# Patient Record
Sex: Female | Born: 1960 | Race: White | Hispanic: No | Marital: Married | Smoking: Never smoker
Health system: Southern US, Community
[De-identification: ages and names within clinical notes are randomized; demographics above are authoritative.]

## PROBLEM LIST (undated history)

## (undated) DIAGNOSIS — F419 Anxiety disorder, unspecified: Secondary | ICD-10-CM

## (undated) DIAGNOSIS — E039 Hypothyroidism, unspecified: Secondary | ICD-10-CM

## (undated) DIAGNOSIS — Z853 Personal history of malignant neoplasm of breast: Secondary | ICD-10-CM

## (undated) DIAGNOSIS — Z8489 Family history of other specified conditions: Secondary | ICD-10-CM

## (undated) DIAGNOSIS — Z98811 Dental restoration status: Secondary | ICD-10-CM

## (undated) HISTORY — PX: HYSTEROSCOPY WITH D & C: SHX1775

## (undated) HISTORY — PX: LAPAROSCOPIC OOPHERECTOMY: SHX6507

## (undated) HISTORY — PX: CATARACT EXTRACTION W/ INTRAOCULAR LENS IMPLANT: SHX1309

---

## 1997-10-05 ENCOUNTER — Other Ambulatory Visit: Admission: RE | Admit: 1997-10-05 | Discharge: 1997-10-05 | Payer: Self-pay | Admitting: Obstetrics and Gynecology

## 1997-11-19 ENCOUNTER — Emergency Department (HOSPITAL_COMMUNITY): Admission: EM | Admit: 1997-11-19 | Discharge: 1997-11-19 | Payer: Self-pay | Admitting: Emergency Medicine

## 1997-11-21 ENCOUNTER — Ambulatory Visit (HOSPITAL_COMMUNITY): Admission: RE | Admit: 1997-11-21 | Discharge: 1997-11-21 | Payer: Self-pay | Admitting: Emergency Medicine

## 1997-11-25 ENCOUNTER — Emergency Department (HOSPITAL_COMMUNITY): Admission: EM | Admit: 1997-11-25 | Discharge: 1997-11-25 | Payer: Self-pay | Admitting: Internal Medicine

## 1997-12-02 ENCOUNTER — Encounter (HOSPITAL_COMMUNITY): Admission: RE | Admit: 1997-12-02 | Discharge: 1998-03-02 | Payer: Self-pay | Admitting: Emergency Medicine

## 1997-12-03 ENCOUNTER — Emergency Department (HOSPITAL_COMMUNITY): Admission: EM | Admit: 1997-12-03 | Discharge: 1997-12-03 | Payer: Self-pay | Admitting: Emergency Medicine

## 1997-12-06 ENCOUNTER — Emergency Department (HOSPITAL_COMMUNITY): Admission: EM | Admit: 1997-12-06 | Discharge: 1997-12-06 | Payer: Self-pay | Admitting: Internal Medicine

## 1998-02-18 ENCOUNTER — Encounter (HOSPITAL_COMMUNITY): Admission: RE | Admit: 1998-02-18 | Discharge: 1998-02-18 | Payer: Self-pay | Admitting: Emergency Medicine

## 1998-10-05 ENCOUNTER — Other Ambulatory Visit: Admission: RE | Admit: 1998-10-05 | Discharge: 1998-10-05 | Payer: Self-pay | Admitting: Obstetrics and Gynecology

## 1999-11-07 ENCOUNTER — Other Ambulatory Visit: Admission: RE | Admit: 1999-11-07 | Discharge: 1999-11-07 | Payer: Self-pay | Admitting: Obstetrics and Gynecology

## 2001-01-01 ENCOUNTER — Other Ambulatory Visit: Admission: RE | Admit: 2001-01-01 | Discharge: 2001-01-01 | Payer: Self-pay | Admitting: *Deleted

## 2002-01-20 ENCOUNTER — Other Ambulatory Visit: Admission: RE | Admit: 2002-01-20 | Discharge: 2002-01-20 | Payer: Self-pay | Admitting: Obstetrics and Gynecology

## 2004-04-12 ENCOUNTER — Encounter (INDEPENDENT_AMBULATORY_CARE_PROVIDER_SITE_OTHER): Payer: Self-pay | Admitting: Diagnostic Radiology

## 2004-04-12 ENCOUNTER — Encounter: Admission: RE | Admit: 2004-04-12 | Discharge: 2004-04-12 | Payer: Self-pay | Admitting: Obstetrics and Gynecology

## 2004-04-12 ENCOUNTER — Encounter (INDEPENDENT_AMBULATORY_CARE_PROVIDER_SITE_OTHER): Payer: Self-pay | Admitting: *Deleted

## 2004-04-19 ENCOUNTER — Encounter (HOSPITAL_COMMUNITY): Admission: RE | Admit: 2004-04-19 | Discharge: 2004-07-18 | Payer: Self-pay | Admitting: General Surgery

## 2004-04-26 ENCOUNTER — Encounter: Admission: RE | Admit: 2004-04-26 | Discharge: 2004-04-26 | Payer: Self-pay | Admitting: General Surgery

## 2004-05-01 ENCOUNTER — Encounter (INDEPENDENT_AMBULATORY_CARE_PROVIDER_SITE_OTHER): Payer: Self-pay | Admitting: *Deleted

## 2004-05-01 ENCOUNTER — Ambulatory Visit (HOSPITAL_COMMUNITY): Admission: RE | Admit: 2004-05-01 | Discharge: 2004-05-01 | Payer: Self-pay | Admitting: General Surgery

## 2004-05-01 ENCOUNTER — Ambulatory Visit (HOSPITAL_BASED_OUTPATIENT_CLINIC_OR_DEPARTMENT_OTHER): Admission: RE | Admit: 2004-05-01 | Discharge: 2004-05-01 | Payer: Self-pay | Admitting: General Surgery

## 2004-05-01 HISTORY — PX: MASTECTOMY, PARTIAL: SHX709

## 2004-05-02 ENCOUNTER — Ambulatory Visit: Payer: Self-pay | Admitting: Oncology

## 2004-05-10 ENCOUNTER — Ambulatory Visit: Admission: RE | Admit: 2004-05-10 | Discharge: 2004-05-12 | Payer: Self-pay | Admitting: Radiation Oncology

## 2004-05-17 ENCOUNTER — Ambulatory Visit (HOSPITAL_BASED_OUTPATIENT_CLINIC_OR_DEPARTMENT_OTHER): Admission: RE | Admit: 2004-05-17 | Discharge: 2004-05-17 | Payer: Self-pay | Admitting: General Surgery

## 2004-05-17 ENCOUNTER — Ambulatory Visit (HOSPITAL_COMMUNITY): Admission: RE | Admit: 2004-05-17 | Discharge: 2004-05-17 | Payer: Self-pay | Admitting: General Surgery

## 2004-05-17 ENCOUNTER — Encounter (INDEPENDENT_AMBULATORY_CARE_PROVIDER_SITE_OTHER): Payer: Self-pay | Admitting: Specialist

## 2004-05-17 HISTORY — PX: RE-EXCISION OF BREAST LUMPECTOMY: SHX6048

## 2004-06-07 ENCOUNTER — Ambulatory Visit: Admission: RE | Admit: 2004-06-07 | Discharge: 2004-09-05 | Payer: Self-pay | Admitting: Radiation Oncology

## 2004-06-21 ENCOUNTER — Encounter: Admission: RE | Admit: 2004-06-21 | Discharge: 2004-06-21 | Payer: Self-pay | Admitting: Radiation Oncology

## 2004-06-23 ENCOUNTER — Encounter: Admission: RE | Admit: 2004-06-23 | Discharge: 2004-06-23 | Payer: Self-pay | Admitting: General Surgery

## 2004-06-23 ENCOUNTER — Encounter (INDEPENDENT_AMBULATORY_CARE_PROVIDER_SITE_OTHER): Payer: Self-pay | Admitting: *Deleted

## 2004-07-06 ENCOUNTER — Encounter: Admission: RE | Admit: 2004-07-06 | Discharge: 2004-07-06 | Payer: Self-pay | Admitting: General Surgery

## 2004-07-06 ENCOUNTER — Encounter (INDEPENDENT_AMBULATORY_CARE_PROVIDER_SITE_OTHER): Payer: Self-pay | Admitting: Specialist

## 2004-07-06 ENCOUNTER — Ambulatory Visit (HOSPITAL_COMMUNITY): Admission: RE | Admit: 2004-07-06 | Discharge: 2004-07-06 | Payer: Self-pay | Admitting: General Surgery

## 2004-07-06 ENCOUNTER — Ambulatory Visit (HOSPITAL_BASED_OUTPATIENT_CLINIC_OR_DEPARTMENT_OTHER): Admission: RE | Admit: 2004-07-06 | Discharge: 2004-07-06 | Payer: Self-pay | Admitting: General Surgery

## 2004-07-06 HISTORY — PX: BREAST EXCISIONAL BIOPSY: SUR124

## 2004-07-24 ENCOUNTER — Encounter: Admission: RE | Admit: 2004-07-24 | Discharge: 2004-07-24 | Payer: Self-pay | Admitting: Radiation Oncology

## 2004-07-26 ENCOUNTER — Ambulatory Visit: Payer: Self-pay | Admitting: Oncology

## 2004-09-06 ENCOUNTER — Ambulatory Visit: Admission: RE | Admit: 2004-09-06 | Discharge: 2004-10-01 | Payer: Self-pay | Admitting: Radiation Oncology

## 2004-10-18 ENCOUNTER — Ambulatory Visit: Payer: Self-pay | Admitting: Oncology

## 2004-10-19 ENCOUNTER — Ambulatory Visit: Admission: RE | Admit: 2004-10-19 | Discharge: 2004-10-19 | Payer: Self-pay | Admitting: Radiation Oncology

## 2005-04-11 ENCOUNTER — Ambulatory Visit: Payer: Self-pay | Admitting: Oncology

## 2005-05-18 ENCOUNTER — Encounter: Admission: RE | Admit: 2005-05-18 | Discharge: 2005-05-18 | Payer: Self-pay | Admitting: General Surgery

## 2005-10-09 ENCOUNTER — Ambulatory Visit: Payer: Self-pay | Admitting: Oncology

## 2005-10-11 LAB — COMPREHENSIVE METABOLIC PANEL
ALT: <8 U/L (ref 0–40)
Albumin: 3.9 g/dL (ref 3.5–5.2)
Alkaline Phosphatase: 38 U/L — ABNORMAL LOW (ref 39–117)
CO2: 26 mEq/L (ref 19–32)
Glucose, Bld: 113 mg/dL — ABNORMAL HIGH (ref 70–99)
Potassium: 3.7 mEq/L (ref 3.5–5.3)
Sodium: 140 mEq/L (ref 135–145)
Total Bilirubin: 0.3 mg/dL (ref 0.3–1.2)
Total Protein: 6.5 g/dL (ref 6.0–8.3)

## 2005-10-11 LAB — CBC WITH DIFFERENTIAL/PLATELET
BASO%: 0.5 % (ref 0.0–2.0)
Eosinophils Absolute: 0 10*3/uL (ref 0.0–0.5)
LYMPH%: 31 % (ref 14.0–48.0)
MCHC: 34.2 g/dL (ref 32.0–36.0)
MONO#: 0.3 10*3/uL (ref 0.1–0.9)
MONO%: 6.2 % (ref 0.0–13.0)
NEUT#: 3.3 10*3/uL (ref 1.5–6.5)
RBC: 3.97 10*6/uL (ref 3.70–5.32)
RDW: 13.1 % (ref 11.3–14.5)
WBC: 5.3 10*3/uL (ref 3.9–10.0)

## 2006-05-20 ENCOUNTER — Encounter: Admission: RE | Admit: 2006-05-20 | Discharge: 2006-05-20 | Payer: Self-pay | Admitting: Oncology

## 2006-05-20 ENCOUNTER — Ambulatory Visit (HOSPITAL_COMMUNITY): Admission: RE | Admit: 2006-05-20 | Discharge: 2006-05-20 | Payer: Self-pay | Admitting: *Deleted

## 2006-11-12 ENCOUNTER — Ambulatory Visit: Payer: Self-pay | Admitting: Oncology

## 2006-11-14 LAB — CBC WITH DIFFERENTIAL/PLATELET
BASO%: 0.6 % (ref 0.0–2.0)
EOS%: 1.3 % (ref 0.0–7.0)
HCT: 34.4 % — ABNORMAL LOW (ref 34.8–46.6)
LYMPH%: 30.8 % (ref 14.0–48.0)
MCH: 30.1 pg (ref 26.0–34.0)
MCHC: 34.1 g/dL (ref 32.0–36.0)
MCV: 88.4 fL (ref 81.0–101.0)
MONO#: 0.2 10*3/uL (ref 0.1–0.9)
MONO%: 6 % (ref 0.0–13.0)
NEUT%: 61.3 % (ref 39.6–76.8)
Platelets: 296 10*3/uL (ref 145–400)
RBC: 3.89 10*6/uL (ref 3.70–5.32)
WBC: 4.1 10*3/uL (ref 3.9–10.0)

## 2006-11-14 LAB — COMPREHENSIVE METABOLIC PANEL
ALT: 8 U/L (ref 0–35)
AST: 9 U/L (ref 0–37)
Albumin: 3.9 g/dL (ref 3.5–5.2)
Alkaline Phosphatase: 37 U/L — ABNORMAL LOW (ref 39–117)
Glucose, Bld: 86 mg/dL (ref 70–99)
Potassium: 4 mEq/L (ref 3.5–5.3)
Sodium: 138 mEq/L (ref 135–145)
Total Protein: 6.6 g/dL (ref 6.0–8.3)

## 2007-02-13 ENCOUNTER — Ambulatory Visit (HOSPITAL_COMMUNITY): Admission: RE | Admit: 2007-02-13 | Discharge: 2007-02-13 | Payer: Self-pay | Admitting: *Deleted

## 2007-02-13 ENCOUNTER — Encounter (INDEPENDENT_AMBULATORY_CARE_PROVIDER_SITE_OTHER): Payer: Self-pay | Admitting: *Deleted

## 2007-05-22 ENCOUNTER — Encounter: Admission: RE | Admit: 2007-05-22 | Discharge: 2007-05-22 | Payer: Self-pay | Admitting: Oncology

## 2007-06-02 ENCOUNTER — Ambulatory Visit: Payer: Self-pay | Admitting: Oncology

## 2007-06-04 LAB — COMPREHENSIVE METABOLIC PANEL
AST: 12 U/L (ref 0–37)
Albumin: 4.1 g/dL (ref 3.5–5.2)
Alkaline Phosphatase: 46 U/L (ref 39–117)
Potassium: 4.3 mEq/L (ref 3.5–5.3)
Sodium: 141 mEq/L (ref 135–145)
Total Bilirubin: 0.2 mg/dL — ABNORMAL LOW (ref 0.3–1.2)
Total Protein: 7.1 g/dL (ref 6.0–8.3)

## 2007-06-04 LAB — CBC WITH DIFFERENTIAL/PLATELET
EOS%: 1.1 % (ref 0.0–7.0)
MCH: 29.5 pg (ref 26.0–34.0)
MCHC: 33.6 g/dL (ref 32.0–36.0)
MCV: 87.8 fL (ref 81.0–101.0)
MONO%: 5.5 % (ref 0.0–13.0)
RBC: 3.98 10*6/uL (ref 3.70–5.32)
RDW: 13.8 % (ref 11.3–14.5)

## 2007-10-31 ENCOUNTER — Emergency Department (HOSPITAL_COMMUNITY): Admission: EM | Admit: 2007-10-31 | Discharge: 2007-10-31 | Payer: Self-pay | Admitting: Emergency Medicine

## 2007-12-03 ENCOUNTER — Ambulatory Visit: Payer: Self-pay | Admitting: Oncology

## 2007-12-08 LAB — CBC WITH DIFFERENTIAL/PLATELET
BASO%: 0.9 % (ref 0.0–2.0)
Basophils Absolute: 0.1 10*3/uL (ref 0.0–0.1)
EOS%: 1.1 % (ref 0.0–7.0)
HGB: 11.3 g/dL — ABNORMAL LOW (ref 11.6–15.9)
LYMPH%: 34.1 % (ref 14.0–48.0)
MONO%: 5.7 % (ref 0.0–13.0)
NEUT#: 3.3 10*3/uL (ref 1.5–6.5)
NEUT%: 58.3 % (ref 39.6–76.8)
RBC: 3.91 10*6/uL (ref 3.70–5.32)
RDW: 14.2 % (ref 11.3–14.5)
WBC: 5.6 10*3/uL (ref 3.9–10.0)

## 2007-12-08 LAB — COMPREHENSIVE METABOLIC PANEL
Albumin: 4.2 g/dL (ref 3.5–5.2)
BUN: 15 mg/dL (ref 6–23)
CO2: 26 mEq/L (ref 19–32)
Calcium: 8.8 mg/dL (ref 8.4–10.5)
Chloride: 104 mEq/L (ref 96–112)
Glucose, Bld: 107 mg/dL — ABNORMAL HIGH (ref 70–99)
Potassium: 4 mEq/L (ref 3.5–5.3)
Sodium: 140 mEq/L (ref 135–145)
Total Protein: 7 g/dL (ref 6.0–8.3)

## 2008-04-09 ENCOUNTER — Ambulatory Visit (HOSPITAL_COMMUNITY): Admission: RE | Admit: 2008-04-09 | Discharge: 2008-04-09 | Payer: Self-pay | Admitting: Obstetrics & Gynecology

## 2008-04-09 ENCOUNTER — Encounter (INDEPENDENT_AMBULATORY_CARE_PROVIDER_SITE_OTHER): Payer: Self-pay | Admitting: Obstetrics & Gynecology

## 2008-05-07 ENCOUNTER — Ambulatory Visit (HOSPITAL_COMMUNITY): Admission: RE | Admit: 2008-05-07 | Discharge: 2008-05-07 | Payer: Self-pay | Admitting: Obstetrics & Gynecology

## 2008-05-07 ENCOUNTER — Encounter (INDEPENDENT_AMBULATORY_CARE_PROVIDER_SITE_OTHER): Payer: Self-pay | Admitting: Obstetrics & Gynecology

## 2008-05-07 HISTORY — PX: LAPAROSCOPIC SALPINGOOPHERECTOMY: SUR795

## 2008-07-14 ENCOUNTER — Ambulatory Visit: Payer: Self-pay | Admitting: Oncology

## 2008-07-16 LAB — COMPREHENSIVE METABOLIC PANEL
Albumin: 4.2 g/dL (ref 3.5–5.2)
BUN: 17 mg/dL (ref 6–23)
CO2: 26 mEq/L (ref 19–32)
Glucose, Bld: 83 mg/dL (ref 70–99)
Potassium: 4.1 mEq/L (ref 3.5–5.3)
Sodium: 140 mEq/L (ref 135–145)
Total Bilirubin: 0.3 mg/dL (ref 0.3–1.2)
Total Protein: 7.2 g/dL (ref 6.0–8.3)

## 2008-07-16 LAB — CBC WITH DIFFERENTIAL/PLATELET
Basophils Absolute: 0 10*3/uL (ref 0.0–0.1)
Eosinophils Absolute: 0 10*3/uL (ref 0.0–0.5)
HGB: 9.4 g/dL — ABNORMAL LOW (ref 11.6–15.9)
LYMPH%: 33 % (ref 14.0–48.0)
MCV: 79.9 fL — ABNORMAL LOW (ref 81.0–101.0)
MONO#: 0.3 10*3/uL (ref 0.1–0.9)
NEUT#: 2.7 10*3/uL (ref 1.5–6.5)
Platelets: 368 10*3/uL (ref 145–400)
RBC: 3.66 10*6/uL — ABNORMAL LOW (ref 3.70–5.32)
WBC: 4.6 10*3/uL (ref 3.9–10.0)

## 2009-01-05 ENCOUNTER — Ambulatory Visit: Payer: Self-pay | Admitting: Oncology

## 2009-01-07 LAB — COMPREHENSIVE METABOLIC PANEL
AST: 17 U/L (ref 0–37)
Albumin: 4 g/dL (ref 3.5–5.2)
Alkaline Phosphatase: 39 U/L (ref 39–117)
BUN: 15 mg/dL (ref 6–23)
Creatinine, Ser: 1.08 mg/dL (ref 0.40–1.20)
Glucose, Bld: 91 mg/dL (ref 70–99)
Potassium: 3.5 mEq/L (ref 3.5–5.3)
Total Bilirubin: 0.4 mg/dL (ref 0.3–1.2)

## 2009-01-07 LAB — CBC WITH DIFFERENTIAL/PLATELET
BASO%: 2.8 % — ABNORMAL HIGH (ref 0.0–2.0)
LYMPH%: 27.9 % (ref 14.0–49.7)
MCHC: 32 g/dL (ref 31.5–36.0)
MCV: 79.7 fL (ref 79.5–101.0)
MONO#: 0.2 10*3/uL (ref 0.1–0.9)
MONO%: 4.4 % (ref 0.0–14.0)
NEUT#: 3.2 10*3/uL (ref 1.5–6.5)
Platelets: 321 10*3/uL (ref 145–400)
RBC: 3.89 10*6/uL (ref 3.70–5.45)
RDW: 17.1 % — ABNORMAL HIGH (ref 11.2–14.5)
WBC: 4.9 10*3/uL (ref 3.9–10.3)

## 2009-04-19 ENCOUNTER — Ambulatory Visit (HOSPITAL_COMMUNITY): Admission: RE | Admit: 2009-04-19 | Discharge: 2009-04-19 | Payer: Self-pay | Admitting: Oncology

## 2009-04-19 ENCOUNTER — Encounter: Admission: RE | Admit: 2009-04-19 | Discharge: 2009-04-19 | Payer: Self-pay | Admitting: Oncology

## 2009-04-25 ENCOUNTER — Ambulatory Visit (HOSPITAL_COMMUNITY): Admission: RE | Admit: 2009-04-25 | Discharge: 2009-04-25 | Payer: Self-pay | Admitting: Oncology

## 2009-04-25 ENCOUNTER — Encounter: Admission: RE | Admit: 2009-04-25 | Discharge: 2009-04-25 | Payer: Self-pay | Admitting: Oncology

## 2009-08-01 ENCOUNTER — Ambulatory Visit: Payer: Self-pay | Admitting: Oncology

## 2009-08-17 LAB — COMPREHENSIVE METABOLIC PANEL
ALT: 13 U/L (ref 0–35)
AST: 18 U/L (ref 0–37)
Albumin: 3.8 g/dL (ref 3.5–5.2)
Alkaline Phosphatase: 36 U/L — ABNORMAL LOW (ref 39–117)
BUN: 13 mg/dL (ref 6–23)
CO2: 29 mEq/L (ref 19–32)
Calcium: 8.8 mg/dL (ref 8.4–10.5)
Chloride: 105 mEq/L (ref 96–112)
Creatinine, Ser: 1.08 mg/dL (ref 0.40–1.20)
Glucose, Bld: 84 mg/dL (ref 70–99)
Potassium: 3.6 mEq/L (ref 3.5–5.3)
Sodium: 139 mEq/L (ref 135–145)
Total Bilirubin: 0.4 mg/dL (ref 0.3–1.2)
Total Protein: 6.9 g/dL (ref 6.0–8.3)

## 2009-08-17 LAB — CBC WITH DIFFERENTIAL/PLATELET
BASO%: 0.6 % (ref 0.0–2.0)
Basophils Absolute: 0 10*3/uL (ref 0.0–0.1)
EOS%: 1.5 % (ref 0.0–7.0)
Eosinophils Absolute: 0.1 10*3/uL (ref 0.0–0.5)
HCT: 30.5 % — ABNORMAL LOW (ref 34.8–46.6)
HGB: 10.1 g/dL — ABNORMAL LOW (ref 11.6–15.9)
LYMPH%: 31.2 % (ref 14.0–49.7)
MCH: 29 pg (ref 25.1–34.0)
MCHC: 33.1 g/dL (ref 31.5–36.0)
MCV: 87.7 fL (ref 79.5–101.0)
MONO#: 0.3 10*3/uL (ref 0.1–0.9)
MONO%: 6 % (ref 0.0–14.0)
NEUT#: 3.4 10*3/uL (ref 1.5–6.5)
NEUT%: 60.7 % (ref 38.4–76.8)
Platelets: 311 10*3/uL (ref 145–400)
RBC: 3.47 10*6/uL — ABNORMAL LOW (ref 3.70–5.45)
RDW: 16.5 % — ABNORMAL HIGH (ref 11.2–14.5)
WBC: 5.6 10*3/uL (ref 3.9–10.3)
lymph#: 1.7 10*3/uL (ref 0.9–3.3)

## 2009-08-17 LAB — VITAMIN D 25 HYDROXY (VIT D DEFICIENCY, FRACTURES): Vit D, 25-Hydroxy: 11 ng/mL — ABNORMAL LOW (ref 30–89)

## 2009-08-17 LAB — IRON AND TIBC: Iron: 25 ug/dL — ABNORMAL LOW (ref 42–145)

## 2009-08-17 LAB — LACTATE DEHYDROGENASE: LDH: 135 U/L (ref 94–250)

## 2009-09-06 ENCOUNTER — Ambulatory Visit: Payer: Self-pay | Admitting: Oncology

## 2010-03-21 ENCOUNTER — Ambulatory Visit: Payer: Self-pay | Admitting: Oncology

## 2010-05-16 ENCOUNTER — Encounter
Admission: RE | Admit: 2010-05-16 | Discharge: 2010-05-16 | Payer: Self-pay | Source: Home / Self Care | Attending: Oncology | Admitting: Oncology

## 2010-05-23 ENCOUNTER — Ambulatory Visit: Payer: Self-pay | Admitting: Oncology

## 2010-05-24 LAB — CBC & DIFF AND RETIC
BASO%: 0.7 % (ref 0.0–2.0)
Basophils Absolute: 0 10*3/uL (ref 0.0–0.1)
EOS%: 1.7 % (ref 0.0–7.0)
HGB: 11.9 g/dL (ref 11.6–15.9)
Immature Retic Fract: 13.4 % — ABNORMAL HIGH (ref 0.00–10.70)
MCH: 29.2 pg (ref 25.1–34.0)
MCHC: 32.2 g/dL (ref 31.5–36.0)
MONO#: 0.3 10*3/uL (ref 0.1–0.9)
RDW: 12.5 % (ref 11.2–14.5)
Retic Ct Abs: 43.66 10*3/uL (ref 18.30–72.70)
WBC: 5.8 10*3/uL (ref 3.9–10.3)
lymph#: 2 10*3/uL (ref 0.9–3.3)

## 2010-05-24 LAB — COMPREHENSIVE METABOLIC PANEL
ALT: 26 U/L (ref 0–35)
AST: 24 U/L (ref 0–37)
Creatinine, Ser: 0.95 mg/dL (ref 0.40–1.20)
Total Bilirubin: 0.4 mg/dL (ref 0.3–1.2)

## 2010-05-24 LAB — MORPHOLOGY: PLT EST: ADEQUATE

## 2010-05-25 LAB — VITAMIN D 25 HYDROXY (VIT D DEFICIENCY, FRACTURES): Vit D, 25-Hydroxy: 14 ng/mL — ABNORMAL LOW (ref 30–89)

## 2010-05-25 LAB — IRON AND TIBC
%SAT: 9 % — ABNORMAL LOW (ref 20–55)
TIBC: 382 ug/dL (ref 250–470)
UIBC: 349 ug/dL

## 2010-06-25 ENCOUNTER — Encounter: Payer: Self-pay | Admitting: Oncology

## 2010-07-20 ENCOUNTER — Other Ambulatory Visit: Payer: Self-pay | Admitting: Oncology

## 2010-07-20 DIAGNOSIS — Z9889 Other specified postprocedural states: Secondary | ICD-10-CM

## 2010-10-17 NOTE — Op Note (Signed)
Brandi Goodman, Brandi Goodman            ACCOUNT NO.:  [id number]   MEDICAL RECORD NO.:  [id number]          PATIENT TYPE:  AMB   LOCATION:  SDC                           FACILITY:  [facility]   PHYSICIAN:  Genia Del, M.D.DATE OF BIRTH:  Aug 31, 1960   DATE OF PROCEDURE:  05/07/2008  DATE OF DISCHARGE:                               OPERATIVE REPORT   PREOPERATIVE DIAGNOSIS:  Left hemorrhagic ovarian cyst.   POSTOPERATIVE DIAGNOSIS:  Left hemorrhagic ovarian cyst with inflamed  left tube.   PROCEDURE:  Laparoscopy, left salpingo-oophorectomy, and evacuation of  hemoperitoneum.   SURGEON:  Genia Del, MD   ASSISTANT:  None.   ANESTHESIOLOGIST:  Angelica Pou, MD   PROCEDURE:  Under general anesthesia with endotracheal intubation, the  patient was in lithotomy position, she was prepped with Betadine on the  abdominal suprapubic, vulvar, and vaginal areas and draped as usual.  A  Foley catheter was put in the bladder.  We inserted a speculum in the  vagina and a tenaculum with intrauterine cannula was inserted.  We  removed the speculum.  We went to abdominal time.  The subcutaneous  tissue was infiltrated with Marcaine one-quarter plain at the  infraumbilical area and incision was done over 1.5 cm with scalpel.  The  aponeurosis was opened with Mayo scissors under direct vision and the  parietal peritoneum was opened bluntly with the finger.  We made a  pursestring stitch of Vicryl 0 at the aponeurosis.  We then inserted the  [facility] with the laparoscope at that level and created pneumoperitoneum  with CO2.  Inspection of the abdominal cavity was normal.  In the pelvic  cavity, we saw large amount of blood clots and that clotted structure  was originating from the left adnexa.  There was a small amount of  hemoperitoneum around the large clotted blood.  We made a contralateral  incision on the left iliac area with a scalpel over 5 mm after  infiltrating the skin with Marcaine  one-quarter plain.  We inserted a 5-  mm trocar at that level under direct vision.  We did the same thing on  the right side.  We then inserted the instruments, first the Nezhat and  an atraumatic clamp.  We irrigated and suctioned as much blood and clots  as possible.  We then visualized the uterus, which was normal in size  and appearance and no evidence of trauma was seen from the recent  hysteroscopy resection, which was done by myself 3 weeks ago.  We  visualized also the right adnexa, very small simple cysts were present  on the ovary.  The right tube was normal in size and appearance.  On the  left adnexa, we succeeded in suctioning enough to visualize the left  tube, which was enlarged and inflamed.  The fimbriae were present and no  hydrosalpinx was present.  The left ovary was not identifiable given the  large cyst mixed with a lot of blood clots.  No clear ovarian tissue  being visible.  The decision was taken to proceed with a left salpingo-  oophorectomy.  We  used the EndoSeal to achieve that.  First the left  ureter was well identified and in normal anatomic position.  We  cauterized and sectioned the left infundibulopelvic ligament, followed  the border of the tube and ovary inferiorly.  We then cauterized and  sectioned the left tube proximally and the left utero-ovarian ligament  and detached the left ovary and tube completely.  We then changed the  camera to a 5 mm and inserted an Endobag through the infraumbilical  trocar.  We removed the left adnexa in the bag and sent it to pathology.  We then went back with the Angio-Seal and completed hemostasis on the  left adnexal pedicles.  Hemostasis was adequate at all levels.  We then  irrigated and suctioned the abdominopelvic cavities.  Pictures were  taken before proceeding with the left salpingo-oophorectomy and after.  We then remove all instruments.  We removed the trocars.  We evacuated  the CO2 as much as possible.  We  closed the aponeurosis with a  pursestring stitch at the infraumbilical incision.  We then closed the  skin at that incision with a Monocryl 4-0 subcuticular stitch.  We  applied Dermabond on all incisions.  The instrument was removed from the  vagina.  The estimated blood loss was 250 mL that was mainly old blood  clots associated with the left adnexa and the hemoperitoneum found at  the beginning of the intervention.  No complication occurred and the  patient was brought to recovery room in good stable status.  Note, that  a dose of Ancef 1 g IV was given before induction.      Genia Del, M.D.  Electronically Signed     ML/MEDQ  D:  05/07/2008  T:  05/08/2008  Job:  [id number]

## 2010-10-17 NOTE — Op Note (Signed)
NAMEPEBBLE, BOTKIN            ACCOUNT NO.:  [id number]   MEDICAL RECORD NO.:  [id number]          PATIENT TYPE:  AMB   LOCATION:  SDC                           [facility]   PHYSICIAN:  [place] B. Earlene Plater, M.D.  DATE OF BIRTH:  1961/02/15   DATE OF PROCEDURE:  02/13/2007  DATE OF DISCHARGE:                               OPERATIVE REPORT   PREOP DIAGNOSIS:  Abnormal uterine bleeding with tamoxifen use and  cervical stenosis.   POSTOPERATIVE DIAGNOSIS:  Abnormal uterine bleeding with tamoxifen use  and cervical stenosis.   PROCEDURE:  Hysteroscopy and dilation and curettage with ultrasound  guidance.   SURGEON:  Chester Holstein. Earlene Plater, M.D.   ASSISTANT:  None.   ANESTHESIA:  LMA and general and 20 mL of 1% lidocaine paracervical  block.   FINDINGS:  Shaggy endometrial tissue without focal mass.   SPECIMEN:  Endometrial curettings to pathology.   BLOOD LOSS:  Minimal.   FLUID DEFICIT:  None.   COMPLICATIONS:  None.   INDICATIONS:  The patient with history of breast cancer using tamoxifen  with recent history of abnormal bleeding.  Ultrasound showed a thickened  endometrial stripe.  Attempt was made in the office to perform  endometrial biopsy which could not be performed nor could a saline  infusion ultrasound be performed in the office for the same reason.  Given this, patient presents for procedure under anesthesia and  ultrasound guidance.  The patient advised of the risks of surgery  include infection, bleeding, perforation, and damage to surrounding  organs.   DESCRIPTION OF PROCEDURE:  The patient taken to the operating room LMA  general anesthesia obtained.  She was prepped and draped in the standard  fashion.  The bladder was left undrained to facilitate ultrasound  guidance.  Speculum inserted, paracervical block placed.  Cervix grasped  anteriorly with a single-tooth tenaculum.  Ultrasound guidance was used.  The lower uterine segment, anterior cervical os  region, could not be  well visualized due to an incompletely full bladder.  Therefore, the  bladder was catheterized and approximately 150 mL of sterile saline was  instilled.  This allowed for good bladder distention and excellent  visualization.  Subsequently it was very easy to dilate the cervix under  ultrasound guidance.  Then the diagnostic hysteroscope was inserted  after being flushed.  Good uterine distention obtained.  No focal mass  was seen, although the overall appearance was of a shaggy endometrium.   The endometrial was then gently curetted under ultrasound guidance and a  small amount of tissue returned.  The instruments were removed.  Cervix  hemostatic.  The patient tolerated the procedure well with no  complications.  Patient was taken to the recovery room awake, alert, and  stable condition.      Gerri Spore B. Earlene Plater, M.D.  Electronically Signed     WBD/MEDQ  D:  02/13/2007  T:  02/14/2007  Job:  [id number]   cc:   Rose Phi. Young, M.D.  [address], [address]  [place]  [place] [address]

## 2010-10-17 NOTE — Op Note (Signed)
[name], Brandi Goodman            ACCOUNT NO.:  [id number]   MEDICAL RECORD NO.:  [id number]          PATIENT TYPE:  AMB   LOCATION:  SDC                           [facility]   PHYSICIAN:  Genia Del, M.D.DATE OF BIRTH:  10-02-1960   DATE OF PROCEDURE:  04/09/2008  DATE OF DISCHARGE:                               OPERATIVE REPORT   PREOPERATIVE DIAGNOSES:  1. Thick endometrium with probable intrauterine polyp.  2. Menometrorrhagia on tamoxifen.   POSTOPERATIVE DIAGNOSES:  1. Thick endometrium with probable intrauterine polyp.  2. Menometrorrhagia on tamoxifen.  3. Confirmed anterior wall endometrial polyps.   PROCEDURE:  Hysteroscopy resection and dilatation and curettage.   SURGEON:  Genia Del, MD   ASSISTANT:  No.   ANESTHESIOLOGIST:  Belva Agee, MD   PROCEDURE:  Under general anesthesia with laryngeal mask, the patient  was in lithotomy position.  She was prepped with Betadine on the  suprapubic, vulvar, and vaginal areas.  The bladder was catheterized and  the patient was draped as usual.  The vaginal exam reveals an anteverted  uterus, mobile, normal volume, no adnexal mass.  The speculum was  introduced in the vagina.  The anterior lip of the cervix was grasped  with a tenaculum.  A paracervical block was done with Nesacaine 1%, 20  mL total at 4 and 8 o'clock.  We then dilate the cervix with Hegar  dilators up to #35 without difficulty.  We inserted the operative  hysteroscope in the intrauterine cavity.  Visualization of the entire  intrauterine cavity, both ostia were seen and pictures were taken.  The  anterior wall of the endometrial cavity, multiple polyps were present.  No other lesion was seen.  We proceed with resection of the endometrial  polyps.  Those were sent separately to Pathology.  We then removed the  hysteroscope.  A systematic endometrial curettage on all surfaces with a  sharp curette.  The specimen of endometrial  curettings was sent to  Pathology.  We had good hemostasis.  We therefore removed all  instruments.  The patient's estimated blood loss was minimal.  No  complications occurred and the patient was brought to recovery room in  good stable status.      Genia Del, M.D.  Electronically Signed     ML/MEDQ  D:  04/09/2008  T:  04/10/2008  Job:  [id number]

## 2010-10-24 NOTE — Op Note (Signed)
Brandi Goodman, Brandi Goodman            ACCOUNT NO.:  [id number]   MEDICAL RECORD NO.:  [id number]          PATIENT TYPE:  AMB   LOCATION:  DSC                          FACILITY:  [facility]   PHYSICIAN:  Rose Phi. Young, M.D.   DATE OF BIRTH:  Jan 23, 1961   DATE OF PROCEDURE:  07/06/2004  DATE OF DISCHARGE:                                 OPERATIVE REPORT   PREOPERATIVE DIAGNOSIS:  Ductal carcinoma in situ of the left breast.   POSTOPERATIVE DIAGNOSIS:  Ductal carcinoma in situ of the left breast.   OPERATION:  Left breast biopsy with needle localized and specimen mammogram.   SURGEON:  Dr. Francina Ames.   ANESTHESIA:  General.   OPERATIVE PROCEDURE:  This 50 year old female had previously had a DCIS of  the left breast excised and had a sentinel node that was negative.  On her  preradiation mammogram, however, there is a new little area of  calcifications closer to the nipple from the original excision that had not  been identified before.  Stereotactic biopsy of these showed DCIS, and it  was felt this needed to be excised before we committed to breast  preservation.   Prior to coming to the operating room, a localizing needle had been placed  in the upper outer quadrant of her left breast.   After suitable general anesthesia was induced, the patient was placed in the  supine position with the arms extended on the arm board and the left breast  prepped and draped in the usual fashion.  I used a more transverse incision  here in order not to create a gap between two curved incisions where I was  concerned about the vascularity of the skin, and we centered the needle in  the excision and then excised the tissue around it.  Specimen mammogram  confirmed the removal of the clip.   We had good hemostasis.  The area was infiltrated with 0.25% Marcaine.  It  was closed in a single layer of 4-0 Monocryl and Steri-Strips.   Dressings were then applied and the patient transferred to the  recovery room  in satisfactory condition having tolerated the procedure well.      PRY/MEDQ  D:  07/06/2004  T:  07/06/2004  Job:  [id number]

## 2010-10-24 NOTE — Op Note (Signed)
Brandi Goodman, Brandi Goodman            ACCOUNT NO.:  [id number]   MEDICAL RECORD NO.:  [id number]          PATIENT TYPE:  AMB   LOCATION:  DSC                          FACILITY:  [facility]   PHYSICIAN:  Rose Phi. Maple Hudson, M.D.   DATE OF BIRTH:  09/19/1960   DATE OF PROCEDURE:  05/17/2004  DATE OF DISCHARGE:                                 OPERATIVE REPORT   PREOPERATIVE DIAGNOSIS:  Carcinoma of the left breast, with a positive  margin.   POSTOPERATIVE DIAGNOSIS:  Carcinoma of the left breast, with a positive  margin.   OPERATION:  Re-excision of left lumpectomy site.   SURGEON:  Rose Phi. Maple Hudson, M.D.   ANESTHESIA:  General.   OPERATIVE PROCEDURE:  The patient placed on the operating room table and the  arm extended on the armboard; left breast was prepped and draped in the  usual fashion.  An elliptical incision around the previous lumpectomy scar  was outlined with a marking pencil, and then I incised that and excised the  entire lumpectomy site, except for the deep lateral margin.  The medial  margin was the one we were most concerned about, and that is where I took  the most tissue.  After removing the specimen, I oriented it for the  pathologist.  Hemostasis was obtained with electrocautery.  We thoroughly  irrigated it with saline.   It was then closed in a single layer of interrupted subcuticular 4-0  Monocryl's with Steri-Strips.  Dressing applied.  The patient transferred to  the recovery room in satisfactory condition, having tolerated the procedure  well.      [name]   PRY/MEDQ  D:  05/17/2004  T:  05/17/2004  Job:  [id number]

## 2010-10-24 NOTE — Op Note (Signed)
NAMEELLYANA, Brandi Goodman            ACCOUNT NO.:  [id number]   MEDICAL RECORD NO.:  [id number]          PATIENT TYPE:  AMB   LOCATION:  DSC                          FACILITY:  [facility]   PHYSICIAN:  Rose Phi. Maple Hudson, M.D.   DATE OF BIRTH:  04/07/1961   DATE OF PROCEDURE:  05/01/2004  DATE OF DISCHARGE:                                 OPERATIVE REPORT   PREOPERATIVE DIAGNOSIS:  Ductal carcinoma in situ -- left breast, with  palpable mass.   POSTOPERATIVE DIAGNOSIS:  Ductal carcinoma in situ -- left breast, with  palpable mass.   OPERATIONS:  1.  Blue dye injection.  2.  Left axillary sentinel lymph node biopsy.  3.  Left partial mastectomy.   SURGEON:  Rose Phi. Maple Hudson, M.D.   ANESTHESIA:  General.   OPERATIVE PROCEDURE:  Prior to coming to the operating room, 1 mC of  technetium-sulfur colloid was injected intradermally.  After suitable general anesthesia was induced, the patient was placed in the  supine position, with the arms extended on the arm board.  Then 5 cc of a  mixture of 2 cc of methylene blue and 3 cc of injectable saline was injected  into the subareolar breast tissue, and the breast gently massaged for three  minutes.  We then prepped and draped in the standard fashion.   Scanning with the Neo probe, the only hot spot was in the left axilla.  A  short transverse left axillary incision was made, with dissection through  the subcutaneous tissue to the clavipectoralis fascia.  Just deep to this  was a blue and hot lymph node, which we removed as a sentinel node.  There  were no other blue, hot or palpable nodes.  This was then submitted to the  pathologist as a sentinel node.   The palpable mass was in the upper outer quadrant, and a curved incision  including an ellipse of skin was then outlined over the palpable mass.  The  incision was made and then a wide excision of this mass was carried out.  Hemostasis obtained with electrocautery.  The deep dissections at the  chest  wall with the pectoralis fascia.  Specimen was then oriented for the  pathologist, and was submitted to evaluate for margins.   The sentinel node was negative for metastatic disease.  The margins were  clean.   With good hemostasis, both incisions were injected with 0.25% Marcaine.  They were closed in two layers with 3-0 Vicryl and subcuticular 4-0 Monocryl  and Steri-Strips.  Dressings were then applied.  The patient transferred to  the recovery room in satisfactory condition, having tolerated the procedure  well.      [name]   PRY/MEDQ  D:  05/01/2004  T:  05/01/2004  Job:  [id number]

## 2011-03-06 LAB — CBC
HCT: 34.5 — ABNORMAL LOW
MCHC: 32.2
MCV: 85.7
RBC: 4.03

## 2011-03-09 LAB — TYPE AND SCREEN
ABO/RH(D): O POS
Antibody Screen: NEGATIVE

## 2011-03-09 LAB — HCG, SERUM, QUALITATIVE: Preg, Serum: NEGATIVE

## 2011-03-16 LAB — CBC
HCT: 37
MCV: 88.3
Platelets: 527 — ABNORMAL HIGH
RDW: 13.6
WBC: 8.4

## 2011-03-16 LAB — DIFFERENTIAL
Basophils Absolute: 0.1
Eosinophils Absolute: 0.1
Eosinophils Relative: 1
Lymphs Abs: 2.2
Neutrophils Relative %: 66

## 2011-03-16 LAB — PREGNANCY, URINE: Preg Test, Ur: NEGATIVE

## 2011-04-30 ENCOUNTER — Telehealth: Payer: Self-pay | Admitting: *Deleted

## 2011-04-30 NOTE — Telephone Encounter (Signed)
informed patient of the new date and time on 05-22-2011 starting at 12:00 with labs.

## 2011-05-22 ENCOUNTER — Other Ambulatory Visit: Payer: Self-pay | Admitting: Oncology

## 2011-05-22 ENCOUNTER — Ambulatory Visit (HOSPITAL_BASED_OUTPATIENT_CLINIC_OR_DEPARTMENT_OTHER): Payer: PRIVATE HEALTH INSURANCE | Admitting: Oncology

## 2011-05-22 ENCOUNTER — Other Ambulatory Visit: Payer: PRIVATE HEALTH INSURANCE | Admitting: Lab

## 2011-05-22 ENCOUNTER — Telehealth: Payer: Self-pay | Admitting: *Deleted

## 2011-05-22 VITALS — BP 109/74 | HR 86 | Temp 98.4°F | Ht 67.0 in | Wt 145.1 lb

## 2011-05-22 DIAGNOSIS — C50919 Malignant neoplasm of unspecified site of unspecified female breast: Secondary | ICD-10-CM

## 2011-05-22 DIAGNOSIS — E559 Vitamin D deficiency, unspecified: Secondary | ICD-10-CM

## 2011-05-22 DIAGNOSIS — D0590 Unspecified type of carcinoma in situ of unspecified breast: Secondary | ICD-10-CM

## 2011-05-22 DIAGNOSIS — D649 Anemia, unspecified: Secondary | ICD-10-CM

## 2011-05-22 LAB — CBC WITH DIFFERENTIAL/PLATELET
BASO%: 0.4 % (ref 0.0–2.0)
EOS%: 0.7 % (ref 0.0–7.0)
MCH: 24.8 pg — ABNORMAL LOW (ref 25.1–34.0)
MCHC: 32 g/dL (ref 31.5–36.0)
MONO#: 0.3 10*3/uL (ref 0.1–0.9)
RBC: 3.88 10*6/uL (ref 3.70–5.45)
RDW: 19.8 % — ABNORMAL HIGH (ref 11.2–14.5)
WBC: 5.9 10*3/uL (ref 3.9–10.3)
lymph#: 1.3 10*3/uL (ref 0.9–3.3)

## 2011-05-22 NOTE — Telephone Encounter (Signed)
gave patient appointment for 11-2011 and mammogram and bone density on 06-2011 printed out calendar and gave to the patient

## 2011-05-22 NOTE — Progress Notes (Signed)
Hematology and Oncology Follow Up Visit  Brandi Goodman [id number] 03-01-1961 50 y.o. 05/22/2011 12:53 PM PCP dr Seymour Bars, dr Purnell Shoemaker  Principle Diagnosis: *DCIS on tamoxifen with status post lumpectomy with reexcision on December of 2006, status post radiation therapy completed April 2006, status post five years of tamoxifen completed April of 2011.  Interim History:  There have been no intercurrent illness, hospitalizations or medication changes.due for mammogram , turned 50, needs a bone density test.  Medications: I have reviewed the patient's current medications.  Allergies: Allergies not on file  Past Medical History, Surgical history, Social history, and Family History were reviewed and updated.  Review of Systems: Constitutional:  Negative for fever, chills, night sweats, anorexia, weight loss, pain. Cardiovascular: no chest pain or dyspnea on exertion Respiratory: no cough, shortness of breath, or wheezing Neurological: no TIA or stroke symptoms Dermatological: negative ENT: negative Skin Gastrointestinal: no abdominal pain, change in bowel habits, or black or bloody stools Genito-Urinary: no dysuria, trouble voiding, or hematuria Hematological and Lymphatic: negative Breast: negative for breast lumps Musculoskeletal: negative Remaining ROS negative. Heavy menses  Physical Exam: Blood pressure 109/74, pulse 86, temperature 98.4 F (36.9 C), height 5\' 7"  (1.702 m), weight 145 lb 1.6 oz (65.817 kg). ECOG:  General appearance: alert, cooperative and appears stated age Head: Normocephalic, without obvious abnormality, atraumatic Neck: no adenopathy, no carotid bruit, no JVD, supple, symmetrical, trachea midline and thyroid not enlarged, symmetric, no tenderness/mass/nodules Lymph nodes: Cervical, supraclavicular, and axillary nodes normal. Cardiac : regular rate and rhythm, no murmurs or gallops Pulmonary:clear to auscultation bilaterally and normal percussion  bilaterally Breasts: inspection negative, no nipple discharge or bleeding, no masses or nodularity palpable Abdomen:soft, non-tender; bowel sounds normal; no masses,  no organomegaly Extremities negative Neuro: alert, oriented, normal speech, no focal findings or movement disorder noted  Lab Results: Lab Results  Component Value Date   WBC 5.9 05/22/2011   HGB 9.6* 05/22/2011   HCT 30.0* 05/22/2011   MCV 77.4* 05/22/2011   PLT 359 05/22/2011     Chemistry      Component Value Date/Time   NA 143 05/24/2010 1621   K 3.7 05/24/2010 1621   CL 104 05/24/2010 1621   CO2 29 05/24/2010 1621   BUN 12 05/24/2010 1621   CREATININE 0.95 05/24/2010 1621      Component Value Date/Time   CALCIUM 9.4 05/24/2010 1621   ALKPHOS 63 05/24/2010 1621   AST 24 05/24/2010 1621   ALT 26 05/24/2010 1621   BILITOT 0.4 05/24/2010 1621      .pathology. Radiological Studies: chest X-ray n/a Mammogram Due this month Bone density Due this month  Impression and Plan: Ms Cham is doing well, but is quite anemic and has very low vitamin d,  I have recommended 1. Consider hysterectomy 2. Receive iv iron 3. Refer to [facility] for colonoscopy 4. Vit d supplementation 5. Bone density test   More than 50% of the visit was spent in patient-related counselling   Pierce Crane, MD 12/18/201212:53 PM

## 2011-05-23 LAB — COMPREHENSIVE METABOLIC PANEL
ALT: <8 U/L (ref 0–35)
AST: 12 U/L (ref 0–37)
Albumin: 4.2 g/dL (ref 3.5–5.2)
CO2: 28 mEq/L (ref 19–32)
Calcium: 8.8 mg/dL (ref 8.4–10.5)
Chloride: 103 mEq/L (ref 96–112)
Creatinine, Ser: 0.9 mg/dL (ref 0.50–1.10)
Potassium: 4 mEq/L (ref 3.5–5.3)
Sodium: 139 mEq/L (ref 135–145)
Total Protein: 7 g/dL (ref 6.0–8.3)

## 2011-06-01 ENCOUNTER — Other Ambulatory Visit: Payer: Self-pay | Admitting: Lab

## 2011-06-01 ENCOUNTER — Ambulatory Visit: Payer: Self-pay | Admitting: Oncology

## 2011-06-14 ENCOUNTER — Ambulatory Visit
Admission: RE | Admit: 2011-06-14 | Discharge: 2011-06-14 | Disposition: A | Discharge status: Home / Self Care | Payer: PRIVATE HEALTH INSURANCE | Source: Ambulatory Visit | Attending: Oncology | Admitting: Oncology

## 2011-06-14 DIAGNOSIS — C50919 Malignant neoplasm of unspecified site of unspecified female breast: Secondary | ICD-10-CM

## 2011-06-14 DIAGNOSIS — D649 Anemia, unspecified: Secondary | ICD-10-CM

## 2011-06-14 DIAGNOSIS — Z9889 Other specified postprocedural states: Secondary | ICD-10-CM

## 2011-07-26 ENCOUNTER — Encounter: Payer: Self-pay | Admitting: Gastroenterology

## 2011-11-20 ENCOUNTER — Other Ambulatory Visit: Payer: PRIVATE HEALTH INSURANCE | Admitting: Lab

## 2011-11-21 ENCOUNTER — Telehealth: Payer: Self-pay | Admitting: *Deleted

## 2011-11-21 ENCOUNTER — Other Ambulatory Visit (HOSPITAL_BASED_OUTPATIENT_CLINIC_OR_DEPARTMENT_OTHER): Payer: PRIVATE HEALTH INSURANCE | Admitting: Lab

## 2011-11-21 DIAGNOSIS — C50919 Malignant neoplasm of unspecified site of unspecified female breast: Secondary | ICD-10-CM

## 2011-11-21 DIAGNOSIS — D649 Anemia, unspecified: Secondary | ICD-10-CM

## 2011-11-21 LAB — CBC & DIFF AND RETIC
BASO%: 0.4 % (ref 0.0–2.0)
LYMPH%: 25.3 % (ref 14.0–49.7)
MCHC: 28.7 g/dL — ABNORMAL LOW (ref 31.5–36.0)
MCV: 75.8 fL — ABNORMAL LOW (ref 79.5–101.0)
MONO%: 7.3 % (ref 0.0–14.0)
Platelets: 328 10*3/uL (ref 145–400)
RBC: 3.68 10*6/uL — ABNORMAL LOW (ref 3.70–5.45)
Retic %: 1.44 % (ref 0.70–2.10)
WBC: 4.5 10*3/uL (ref 3.9–10.3)

## 2011-11-21 LAB — COMPREHENSIVE METABOLIC PANEL
AST: 12 U/L (ref 0–37)
BUN: 13 mg/dL (ref 6–23)
Calcium: 8.8 mg/dL (ref 8.4–10.5)
Chloride: 105 mEq/L (ref 96–112)
Creatinine, Ser: 0.89 mg/dL (ref 0.50–1.10)
Total Bilirubin: 0.3 mg/dL (ref 0.3–1.2)

## 2011-11-21 LAB — IRON AND TIBC
Iron: <10 ug/dL — ABNORMAL LOW (ref 42–145)
UIBC: 407 ug/dL — ABNORMAL HIGH (ref 125–400)

## 2011-11-21 LAB — LACTATE DEHYDROGENASE: LDH: 126 U/L (ref 94–250)

## 2011-11-21 NOTE — Telephone Encounter (Signed)
Called home and cell # [email or web address] to call me

## 2011-11-25 ENCOUNTER — Encounter: Payer: Self-pay | Admitting: Oncology

## 2011-11-27 ENCOUNTER — Telehealth: Payer: Self-pay | Admitting: *Deleted

## 2011-11-27 ENCOUNTER — Ambulatory Visit: Payer: PRIVATE HEALTH INSURANCE | Admitting: Oncology

## 2011-11-27 ENCOUNTER — Ambulatory Visit (HOSPITAL_BASED_OUTPATIENT_CLINIC_OR_DEPARTMENT_OTHER): Payer: PRIVATE HEALTH INSURANCE | Admitting: Oncology

## 2011-11-27 VITALS — BP 119/75 | HR 93 | Temp 98.9°F | Ht 67.0 in | Wt 148.5 lb

## 2011-11-27 DIAGNOSIS — D509 Iron deficiency anemia, unspecified: Secondary | ICD-10-CM

## 2011-11-27 DIAGNOSIS — D649 Anemia, unspecified: Secondary | ICD-10-CM

## 2011-11-27 DIAGNOSIS — D0590 Unspecified type of carcinoma in situ of unspecified breast: Secondary | ICD-10-CM

## 2011-11-27 DIAGNOSIS — C50919 Malignant neoplasm of unspecified site of unspecified female breast: Secondary | ICD-10-CM

## 2011-11-27 DIAGNOSIS — E559 Vitamin D deficiency, unspecified: Secondary | ICD-10-CM

## 2011-11-27 DIAGNOSIS — N920 Excessive and frequent menstruation with regular cycle: Secondary | ICD-10-CM

## 2011-11-27 NOTE — Telephone Encounter (Signed)
One week before the md appointment sent michelle a email to set up treatment for the 12-07-2011

## 2011-11-27 NOTE — Progress Notes (Signed)
Hematology and Oncology Follow Up Visit  QUETZALLI CLOS [id number] [birth year] 51 y.o. 11/27/2011 5:55 PM PCP dr Seymour Bars, dr Purnell Shoemaker  Principle Diagnosis: *DCIS on tamoxifen with status post lumpectomy with reexcision on December of 2006, status post radiation therapy completed April 2006, status post five years of tamoxifen completed April of 2011. History iron deficiency anemia secondary to menorrhagia  Interim History:  There have been no intercurrent illness, hospitalizations or medication changes She did not have her IV iron is as scheduled. She continues to feel somewhat fatigued. She did not have a colonoscopy.  Medications: I have reviewed the patient's current medications.  Allergies: No Known Allergies  Past Medical History, Surgical history, Social history, and Family History were reviewed and updated.  Review of Systems: Constitutional:  Negative for fever, chills, night sweats, anorexia, weight loss, pain. Cardiovascular: no chest pain or dyspnea on exertion Respiratory: no cough, shortness of breath, or wheezing Neurological: no TIA or stroke symptoms Dermatological: negative ENT: negative Skin Gastrointestinal: no abdominal pain, change in bowel habits, or black or bloody stools Genito-Urinary: no dysuria, trouble voiding, or hematuria Hematological and Lymphatic: negative Breast: negative for breast lumps Musculoskeletal: negative Remaining ROS negative. Heavy menses  Physical Exam: Blood pressure 119/75, pulse 93, temperature 98.9 F (37.2 C), temperature source Oral, height 5\' 7"  (1.702 m), weight 148 lb 8 oz (67.359 kg). ECOG:  General appearance: alert, cooperative and appears stated age Head: Normocephalic, without obvious abnormality, atraumatic Neck: no adenopathy, no carotid bruit, no JVD, supple, symmetrical, trachea midline and thyroid not enlarged, symmetric, no tenderness/mass/nodules Lymph nodes: Cervical, supraclavicular, and axillary nodes  normal. Cardiac : regular rate and rhythm, no murmurs or gallops Pulmonary:clear to auscultation bilaterally and normal percussion bilaterally Breasts: inspection negative, no nipple discharge or bleeding, no masses or nodularity palpable, left breast status post lumpectomy no obvious masses. Abdomen:soft, non-tender; bowel sounds normal; no masses,  no organomegaly Extremities negative Neuro: alert, oriented, normal speech, no focal findings or movement disorder noted  Lab Results: Lab Results  Component Value Date   WBC 4.5 11/21/2011   HGB 8.0* 11/21/2011   HCT 27.9* 11/21/2011   MCV 75.8* 11/21/2011   PLT 328 11/21/2011     Chemistry      Component Value Date/Time   NA 141 11/21/2011 0740   K 4.4 11/21/2011 0740   CL 105 11/21/2011 0740   CO2 28 11/21/2011 0740   BUN 13 11/21/2011 0740   CREATININE 0.89 11/21/2011 0740      Component Value Date/Time   CALCIUM 8.8 11/21/2011 0740   ALKPHOS 42 11/21/2011 0740   AST 12 11/21/2011 0740   ALT <8 11/21/2011 0740   BILITOT 0.3 11/21/2011 0740      .pathology. Radiological Studies: chest X-ray n/a Mammogram Due this month Bone density Due this month  Impression and Plan: DCIS status post lumpectomy radiation and 5 years of tamoxifen.  History of anemia secondary to menorrhagia. I recommended she get IV iron. She sees her gynecologist who will hopefully perform what sounds like a hysteroscopy. I will see her in 6 months for followup mammogram. More than 50% of the visit was spent in patient-related counselling   Pierce Crane, MD 6/25/20135:55 PM

## 2011-11-29 ENCOUNTER — Telehealth: Payer: Self-pay | Admitting: *Deleted

## 2011-11-29 NOTE — Telephone Encounter (Signed)
PATIENT CONFIRMED OVER THE PHONE THE NEW DATE AND TIME FOR THE IRON IV

## 2011-12-07 ENCOUNTER — Ambulatory Visit (HOSPITAL_BASED_OUTPATIENT_CLINIC_OR_DEPARTMENT_OTHER): Payer: PRIVATE HEALTH INSURANCE

## 2011-12-07 VITALS — BP 124/68 | HR 90 | Temp 97.5°F

## 2011-12-07 DIAGNOSIS — D649 Anemia, unspecified: Secondary | ICD-10-CM

## 2011-12-07 MED ORDER — SODIUM CHLORIDE 0.9 % IV SOLN
Freq: Once | INTRAVENOUS | Status: AC
  Administered 2011-12-07: 10:00:00 via INTRAVENOUS

## 2011-12-07 MED ORDER — SODIUM CHLORIDE 0.9 % IV SOLN
1020.0000 mg | Freq: Once | INTRAVENOUS | Status: AC
  Administered 2011-12-07: 1020 mg via INTRAVENOUS
  Filled 2011-12-07: qty 34

## 2011-12-07 MED ORDER — SODIUM CHLORIDE 0.9 % IJ SOLN
10.0000 mL | INTRAMUSCULAR | Status: DC | PRN
  Filled 2011-12-07: qty 10

## 2012-05-22 ENCOUNTER — Other Ambulatory Visit: Payer: Self-pay | Admitting: *Deleted

## 2012-05-22 DIAGNOSIS — D649 Anemia, unspecified: Secondary | ICD-10-CM

## 2012-05-22 DIAGNOSIS — C50919 Malignant neoplasm of unspecified site of unspecified female breast: Secondary | ICD-10-CM

## 2012-05-23 ENCOUNTER — Other Ambulatory Visit (HOSPITAL_BASED_OUTPATIENT_CLINIC_OR_DEPARTMENT_OTHER): Payer: PRIVATE HEALTH INSURANCE | Admitting: Lab

## 2012-05-23 DIAGNOSIS — C50419 Malignant neoplasm of upper-outer quadrant of unspecified female breast: Secondary | ICD-10-CM

## 2012-05-24 ENCOUNTER — Telehealth: Payer: Self-pay | Admitting: *Deleted

## 2012-05-24 NOTE — Telephone Encounter (Signed)
patient would like to cancel her appointment and just wait till the first of the year

## 2012-05-30 ENCOUNTER — Ambulatory Visit: Payer: PRIVATE HEALTH INSURANCE | Admitting: Oncology

## 2012-08-05 ENCOUNTER — Other Ambulatory Visit: Payer: Self-pay

## 2012-08-05 DIAGNOSIS — Z9889 Other specified postprocedural states: Secondary | ICD-10-CM

## 2012-08-05 DIAGNOSIS — Z1231 Encounter for screening mammogram for malignant neoplasm of breast: Secondary | ICD-10-CM

## 2012-08-12 ENCOUNTER — Other Ambulatory Visit: Payer: Self-pay | Admitting: Dermatology

## 2012-08-28 ENCOUNTER — Ambulatory Visit
Admission: RE | Admit: 2012-08-28 | Discharge: 2012-08-28 | Disposition: A | Discharge status: Home / Self Care | Payer: Self-pay | Source: Ambulatory Visit

## 2012-08-28 DIAGNOSIS — Z1231 Encounter for screening mammogram for malignant neoplasm of breast: Secondary | ICD-10-CM

## 2012-08-28 DIAGNOSIS — Z9889 Other specified postprocedural states: Secondary | ICD-10-CM

## 2012-09-17 ENCOUNTER — Other Ambulatory Visit: Payer: Self-pay | Admitting: Dermatology

## 2013-08-12 ENCOUNTER — Other Ambulatory Visit: Payer: Self-pay | Admitting: Dermatology

## 2013-10-08 ENCOUNTER — Other Ambulatory Visit: Payer: Self-pay | Admitting: Dermatology

## 2013-10-20 ENCOUNTER — Other Ambulatory Visit: Payer: Self-pay

## 2013-10-20 DIAGNOSIS — Z1231 Encounter for screening mammogram for malignant neoplasm of breast: Secondary | ICD-10-CM

## 2013-11-02 ENCOUNTER — Ambulatory Visit
Admission: RE | Admit: 2013-11-02 | Discharge: 2013-11-02 | Disposition: A | Discharge status: Home / Self Care | Payer: PRIVATE HEALTH INSURANCE | Source: Ambulatory Visit

## 2013-11-02 ENCOUNTER — Encounter (INDEPENDENT_AMBULATORY_CARE_PROVIDER_SITE_OTHER): Payer: Self-pay

## 2013-11-02 DIAGNOSIS — Z1231 Encounter for screening mammogram for malignant neoplasm of breast: Secondary | ICD-10-CM

## 2014-03-31 ENCOUNTER — Other Ambulatory Visit: Payer: Self-pay | Admitting: Nurse Practitioner

## 2014-12-16 ENCOUNTER — Other Ambulatory Visit: Payer: Self-pay

## 2014-12-16 DIAGNOSIS — Z853 Personal history of malignant neoplasm of breast: Secondary | ICD-10-CM

## 2014-12-16 DIAGNOSIS — Z9889 Other specified postprocedural states: Secondary | ICD-10-CM

## 2014-12-16 DIAGNOSIS — Z1231 Encounter for screening mammogram for malignant neoplasm of breast: Secondary | ICD-10-CM

## 2015-01-13 ENCOUNTER — Ambulatory Visit
Admission: RE | Admit: 2015-01-13 | Discharge: 2015-01-13 | Disposition: A | Discharge status: Home / Self Care | Payer: 59 | Source: Ambulatory Visit

## 2015-01-13 DIAGNOSIS — Z853 Personal history of malignant neoplasm of breast: Secondary | ICD-10-CM

## 2015-01-13 DIAGNOSIS — Z1231 Encounter for screening mammogram for malignant neoplasm of breast: Secondary | ICD-10-CM

## 2015-01-13 DIAGNOSIS — Z9889 Other specified postprocedural states: Secondary | ICD-10-CM

## 2016-08-15 ENCOUNTER — Other Ambulatory Visit: Payer: Self-pay | Admitting: Obstetrics and Gynecology

## 2016-08-15 DIAGNOSIS — Z1231 Encounter for screening mammogram for malignant neoplasm of breast: Secondary | ICD-10-CM

## 2016-09-14 ENCOUNTER — Ambulatory Visit
Admission: RE | Admit: 2016-09-14 | Discharge: 2016-09-14 | Disposition: A | Discharge status: Home / Self Care | Payer: 59 | Source: Ambulatory Visit | Attending: Obstetrics and Gynecology | Admitting: Obstetrics and Gynecology

## 2016-09-14 DIAGNOSIS — Z1231 Encounter for screening mammogram for malignant neoplasm of breast: Secondary | ICD-10-CM

## 2016-09-18 ENCOUNTER — Other Ambulatory Visit: Payer: Self-pay | Admitting: Obstetrics and Gynecology

## 2016-09-18 DIAGNOSIS — R928 Other abnormal and inconclusive findings on diagnostic imaging of breast: Secondary | ICD-10-CM

## 2016-09-20 ENCOUNTER — Other Ambulatory Visit: Payer: Self-pay | Admitting: Obstetrics and Gynecology

## 2016-09-20 ENCOUNTER — Ambulatory Visit
Admission: RE | Admit: 2016-09-20 | Discharge: 2016-09-20 | Disposition: A | Discharge status: Home / Self Care | Payer: 59 | Source: Ambulatory Visit | Attending: Obstetrics and Gynecology | Admitting: Obstetrics and Gynecology

## 2016-09-20 DIAGNOSIS — R928 Other abnormal and inconclusive findings on diagnostic imaging of breast: Secondary | ICD-10-CM

## 2016-09-20 DIAGNOSIS — R921 Mammographic calcification found on diagnostic imaging of breast: Secondary | ICD-10-CM

## 2016-09-24 ENCOUNTER — Ambulatory Visit
Admission: RE | Admit: 2016-09-24 | Discharge: 2016-09-24 | Disposition: A | Discharge status: Home / Self Care | Payer: 59 | Source: Ambulatory Visit | Attending: Obstetrics and Gynecology | Admitting: Obstetrics and Gynecology

## 2016-09-24 DIAGNOSIS — R921 Mammographic calcification found on diagnostic imaging of breast: Secondary | ICD-10-CM

## 2016-10-02 ENCOUNTER — Telehealth: Payer: Self-pay | Admitting: Oncology

## 2016-10-02 ENCOUNTER — Encounter: Payer: Self-pay | Admitting: Oncology

## 2016-10-02 NOTE — Telephone Encounter (Signed)
Appt has been scheduled for the pt to see Dr. Jana Hakim on 5/8 at 430pm. Pt has agreed to the appt date and time. Demographics verified. Letter mailed.

## 2016-10-05 ENCOUNTER — Ambulatory Visit: Payer: Self-pay | Admitting: General Surgery

## 2016-10-05 DIAGNOSIS — C50912 Malignant neoplasm of unspecified site of left female breast: Secondary | ICD-10-CM

## 2016-10-09 ENCOUNTER — Ambulatory Visit (HOSPITAL_BASED_OUTPATIENT_CLINIC_OR_DEPARTMENT_OTHER): Payer: 59 | Admitting: Oncology

## 2016-10-09 ENCOUNTER — Other Ambulatory Visit (HOSPITAL_BASED_OUTPATIENT_CLINIC_OR_DEPARTMENT_OTHER): Payer: 59

## 2016-10-09 ENCOUNTER — Other Ambulatory Visit: Payer: Self-pay | Admitting: Adult Health

## 2016-10-09 DIAGNOSIS — D0512 Intraductal carcinoma in situ of left breast: Secondary | ICD-10-CM | POA: Diagnosis not present

## 2016-10-09 DIAGNOSIS — Z170 Estrogen receptor positive status [ER+]: Secondary | ICD-10-CM | POA: Diagnosis not present

## 2016-10-09 LAB — CBC WITH DIFFERENTIAL/PLATELET
BASO%: 0.4 % (ref 0.0–2.0)
Basophils Absolute: 0 10*3/uL (ref 0.0–0.1)
EOS ABS: 0.1 10*3/uL (ref 0.0–0.5)
EOS%: 1.5 % (ref 0.0–7.0)
HEMATOCRIT: 38.4 % (ref 34.8–46.6)
HGB: 12.2 g/dL (ref 11.6–15.9)
LYMPH#: 1.5 10*3/uL (ref 0.9–3.3)
LYMPH%: 30.5 % (ref 14.0–49.7)
MCH: 31.6 pg (ref 25.1–34.0)
MCHC: 31.8 g/dL (ref 31.5–36.0)
MCV: 99.5 fL (ref 79.5–101.0)
MONO#: 0.3 10*3/uL (ref 0.1–0.9)
MONO%: 6 % (ref 0.0–14.0)
NEUT#: 3 10*3/uL (ref 1.5–6.5)
NEUT%: 61.6 % (ref 38.4–76.8)
PLATELETS: 248 10*3/uL (ref 145–400)
RBC: 3.86 10*6/uL (ref 3.70–5.45)
RDW: 14 % (ref 11.2–14.5)
WBC: 4.8 10*3/uL (ref 3.9–10.3)

## 2016-10-09 LAB — COMPREHENSIVE METABOLIC PANEL
ALT: 11 U/L (ref 0–55)
ANION GAP: 8 meq/L (ref 3–11)
AST: 16 U/L (ref 5–34)
Albumin: 4.2 g/dL (ref 3.5–5.0)
Alkaline Phosphatase: 50 U/L (ref 40–150)
BILIRUBIN TOTAL: 0.43 mg/dL (ref 0.20–1.20)
BUN: 16.3 mg/dL (ref 7.0–26.0)
CALCIUM: 9.8 mg/dL (ref 8.4–10.4)
CHLORIDE: 103 meq/L (ref 98–109)
CO2: 29 mEq/L (ref 22–29)
CREATININE: 1.3 mg/dL — AB (ref 0.6–1.1)
EGFR: 48 mL/min/{1.73_m2} — ABNORMAL LOW (ref >90)
Glucose: 89 mg/dl (ref 70–140)
Potassium: 3.8 mEq/L (ref 3.5–5.1)
Sodium: 140 mEq/L (ref 136–145)
TOTAL PROTEIN: 7.3 g/dL (ref 6.4–8.3)

## 2016-10-09 NOTE — Progress Notes (Signed)
[place]  [phone] [phone]     ID: Brandi Goodman DOB: May 09, 1961  MR#: [id number]  SEG#:[id number]  Patient Care Team: Brandi Gully, NP as PCP - General (Obstetrics and Gynecology) Brandi Goodman, Brandi Lofty, DO as Attending Physician (Plastic Surgery) Brandi Goodman, Brandi Dad, MD as Consulting Physician (Oncology) Brandi Kussmaul, MD as Consulting Physician (General Surgery) Brandi Cruel, MD OTHER MD:  CHIEF COMPLAINT:   CURRENT TREATMENT:    BREAST CANCER HISTORY: "Brandi Goodman" had bilateral screening mammography with tomography at the [facility] 09/14/2016, showing the breast density to be category D. In the left breast there were new calcifications and on 09/20/2016 diagnostic mammography confirmed a group of suspicious calcifications in a linear distribution in the upper outer quadrant of the left breast measuring 1.8 cm. Breast density on the sixth tandem was described as C.  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  Of note, the patient has a history of left-sided ductal carcinoma in situ, estrogen and progesterone receptor positive, status post lumpectomy in 2006 under Dr. Marylene Goodman, followed by radiation then tamoxifen for 5 years completed April 2011.  Her subsequent history is as detailed below  INTERVAL HISTORY: Brandi Goodman was evaluated in the breast clinic 10/09/2016. Her case was also presented in the multidisciplinary breast cancer conference 10/03/2016. At that time a preliminary plan was proposed: Left mastectomy with sentinel lymph node sampling, with consideration of anti-estrogens. The patient was not felt to be a COMET trial candidate  REVIEW OF SYSTEMS: There were no specific symptoms leading to the original mammogram, which was routinely scheduled. The  patient denies unusual headaches, visual changes, nausea, vomiting, stiff neck, dizziness, or gait imbalance. There has been no cough, phlegm production, or pleurisy, no chest pain or pressure, and no change in bowel or bladder habits. The patient denies fever, rash, bleeding, unexplained fatigue or unexplained weight loss. A detailed review of systems was otherwise entirely negative.   PAST MEDICAL HISTORY: No past medical history on file.  PAST SURGICAL HISTORY: Past Surgical History:  Procedure Laterality Date  . BREAST BIOPSY Left    Stereo   . BREAST LUMPECTOMY Left     FAMILY HISTORY Family History  Problem Relation Age of Onset  . Breast cancer Neg Hx   The patient's parents are still living as of May 2018. The patient's mother underwent mastectomy for precancerous lesion remotely. The patient is an only child  GYNECOLOGIC HISTORY:  No LMP recorded. Patient is postmenopausal. Menarche age 68, first live birth age 77, she is [place] P2. She underwent menopause in her late 26s. She did not take hormone replacement  SOCIAL HISTORY:  Brandi Goodman works in an office. Her husband Brandi Goodman works in Psychologist, educational. Son Brandi Goodman works in [employer] in [place]. 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 The patient is expecting her first grandchild 10/30/2016. Brandi Goodman attends a [employer] in [place].    ADVANCED DIRECTIVES:    HEALTH MAINTENANCE: Social History  Substance Use Topics  . Smoking status: Not on file  . Smokeless tobacco: Not on file  . Alcohol use Not on file     Colonoscopy:  PAP:  Bone density: Z score -1.3 06/14/2011   No Known Allergies  Current Outpatient Prescriptions  Medication Sig Dispense Refill  . Multiple Vitamin (MULTIVITAMIN WITH MINERALS) TABS tablet  Take 1 tablet by mouth daily.    . Omega-3 Fatty Acids (FISH OIL) 1000 MG CAPS Take 1 capsule by mouth daily.    . Cholecalciferol (VITAMIN D) 1000 UNITS capsule Take  2,000 Units by mouth daily.    Marland Kitchen levothyroxine (SYNTHROID, LEVOTHROID) 125 MCG tablet Take 125 mcg by mouth daily.     No current facility-administered medications for this visit.     OBJECTIVE: Middle-aged white woman in no acute distress  Vitals:   10/09/16 1623  BP: 122/70  Pulse: 66  Temp: 98.1 F (36.7 C)     Body mass index is 24.65 kg/m.    ECOG FS:0 - Asymptomatic  Ocular: Sclerae unicteric, pupils equal, round and reactive to light Ear-nose-throat: Oropharynx clear and moist Lymphatic: No cervical or supraclavicular adenopathy Lungs no rales or rhonchi, good excursion bilaterally Heart regular rate and rhythm, no murmur appreciated Abd soft, nontender, positive bowel sounds MSK no focal spinal tenderness, no joint edema Neuro: non-focal, well-oriented, appropriate affect Breasts: The right breast is unremarkable. The left breast is status post remote lumpectomy with an excellent cosmetic result. There are no palpable lesions. There are no skin or nipple changes of concern. Both axillae are benign.   LAB RESULTS:  CMP     Component Value Date/Time   NA 140 10/09/2016 1553   K 3.8 10/09/2016 1553   CL 105 11/21/2011 0740   CO2 29 10/09/2016 1553   GLUCOSE 89 10/09/2016 1553   BUN 16.3 10/09/2016 1553   CREATININE 1.3 (H) 10/09/2016 1553   CALCIUM 9.8 10/09/2016 1553   PROT 7.3 10/09/2016 1553   ALBUMIN 4.2 10/09/2016 1553   AST 16 10/09/2016 1553   ALT 11 10/09/2016 1553   ALKPHOS 50 10/09/2016 1553   BILITOT 0.43 10/09/2016 1553    No results found for: TOTALPROTELP, ALBUMINELP, A1GS, A2GS, BETS, BETA2SER, GAMS, MSPIKE, SPEI  No results found for: Nils Pyle, [facility]  Lab Results  Component Value Date   WBC 4.8 10/09/2016   NEUTROABS 3.0 10/09/2016   HGB 12.2 10/09/2016   HCT 38.4 10/09/2016   MCV 99.5 10/09/2016   PLT 248 10/09/2016      Chemistry      Component Value Date/Time   NA 140 10/09/2016 1553   K 3.8 10/09/2016  1553   CL 105 11/21/2011 0740   CO2 29 10/09/2016 1553   BUN 16.3 10/09/2016 1553   CREATININE 1.3 (H) 10/09/2016 1553      Component Value Date/Time   CALCIUM 9.8 10/09/2016 1553   ALKPHOS 50 10/09/2016 1553   AST 16 10/09/2016 1553   ALT 11 10/09/2016 1553   BILITOT 0.43 10/09/2016 1553       No results found for: LABCA2  No components found for: [id number]  No results for input(s): INR in the last 168 hours.  Urinalysis No results found for: COLORURINE, APPEARANCEUR, LABSPEC, PHURINE, GLUCOSEU, HGBUR, BILIRUBINUR, KETONESUR, PROTEINUR, UROBILINOGEN, NITRITE, LEUKOCYTESUR   STUDIES: Mm Digital Diagnostic Unilat L  Result Date: 09/20/2016 CLINICAL DATA:  Left breast calcifications seen on most recent screening mammography. Patient has a history of left breast cancer, status post left lumpectomy. EXAM: DIGITAL DIAGNOSTIC LEFT MAMMOGRAM WITH CAD COMPARISON:  Previous exam(s). ACR Breast Density Category c: The breast tissue is heterogeneously dense, which may obscure small masses. FINDINGS: There is a group of suspicious calcifications in linear distribution in the left slightly upper outer quadrant, anterior depth, which span 1.7 x 1.8 x 1.4 cm. There is no associated mass.  Mammographic images were processed with CAD. IMPRESSION: Suspicious left breast calcifications, for which stereotactic core needle biopsy is recommended. RECOMMENDATION: Stereotactic core needle biopsy of the left breast. I have discussed the findings and recommendations with the patient. Results were also provided in writing at the conclusion of the visit. If applicable, a reminder letter will be sent to the patient regarding the next appointment. BI-RADS CATEGORY  4: Suspicious. Electronically Signed   By: Fidela Salisbury M.D.   On: 09/20/2016 08:22   Mm Screening Breast Tomo Bilateral  Result Date: 09/17/2016 CLINICAL DATA:  Screening. Left lumpectomy with radiation therapy in 2006. EXAM: 2D DIGITAL  SCREENING BILATERAL MAMMOGRAM WITH CAD AND ADJUNCT TOMO COMPARISON:  Previous exam(s). ACR Breast Density Category d: The breast tissue is extremely dense, which lowers the sensitivity of mammography. FINDINGS: In the left breast, calcifications warrant further evaluation. In the right breast, no findings suspicious for malignancy. Images were processed with CAD. IMPRESSION: Further evaluation is suggested for calcifications in the left breast. RECOMMENDATION: Diagnostic mammogram of the left breast. (Code:[id number]) The patient will be contacted regarding the findings, and additional imaging will be scheduled. BI-RADS CATEGORY  0: Incomplete. Need additional imaging evaluation and/or prior mammograms for comparison. Electronically Signed   By: Nolon Nations M.D.   On: 09/17/2016 10:14   Mm Clip Placement Left  Result Date: 09/24/2016 CLINICAL DATA:  Status post stereotactic guided core biopsy of calcifications in the upper-outer quadrant of the left breast. EXAM: DIAGNOSTIC LEFT MAMMOGRAM POST STEREOTACTIC BIOPSY COMPARISON:  Previous exam(s). FINDINGS: Mammographic images were obtained following stereotactic guided biopsy of calcifications in the upper-outer quadrant of the left breast. A coil shaped clip is identified in the medial midportion of the left breast, approximately 6 cm medial to the biopsy site. Residual calcifications are present and significantly decreased in number compared to pre-biopsy exam. IMPRESSION: Medial migration of the tissue marker clip. Coil shaped marker should NOT be used as a guide for subsequent localizations. Final Assessment: Post Procedure Mammograms for Marker Placement Electronically Signed   By: Nolon Nations M.D.   On: 09/24/2016 13:43   Mm Lt Breast Bx W Loc Dev 1st Lesion Image Bx Spec Stereo Guide  Addendum Date: 09/27/2016   ADDENDUM REPORT: 09/26/2016 11:23 ADDENDUM: Pathology revealed intermediate grade ductal carcinoma in situ with necrosis and  calcifications in the left breast. This was found to be concordant by Dr. Nolon Nations. Pathology results were discussed with the patient by telephone. The patient reported doing well after the biopsy with tenderness at the site. Post biopsy instructions and care were reviewed and questions were answered. The patient was encouraged to call [facility] for any additional concerns. Surgical consultation has been arranged with Dr. Autumn Messing at [facility] on September 27, 2016. Pathology results reported by Susa Raring RN, BSN on 09/26/2016. Electronically Signed   By: Nolon Nations M.D.   On: 09/26/2016 11:23   Result Date: 09/27/2016 CLINICAL DATA:  The patient presents for stereotactic guided core biopsy of calcifications in the upper-outer quadrant of the left breast. History of left lumpectomy with radiation therapy in 2006. EXAM: LEFT BREAST STEREOTACTIC CORE NEEDLE BIOPSY COMPARISON:  Previous exams. FINDINGS: The patient and I discussed the procedure of stereotactic-guided biopsy including benefits and alternatives. We discussed the high likelihood of a successful procedure. We discussed the risks of the procedure including infection, bleeding, tissue injury, clip migration, and inadequate sampling. Informed written consent was given. The usual time  out protocol was performed immediately prior to the procedure. Using sterile technique and 1% Lidocaine as local anesthetic, under stereotactic guidance, a 9 gauge vacuum assisted device was used to perform core needle biopsy of calcifications in the upper-outer quadrant of the left breast using a lateral approach. Specimen radiograph was performed showing calcifications to be present. Specimens with calcifications are identified for pathology. Lesion quadrant: Upper-outer quadrant of the left breast At the conclusion of the procedure, a coil shaped tissue marker clip was deployed into the biopsy cavity.  Follow-up 2-view mammogram was performed and dictated separately. IMPRESSION: Stereotactic-guided biopsy of left breast calcifications. No apparent complications. Electronically Signed: By: Nolon Nations M.D. On: 09/24/2016 13:38    ELIGIBLE FOR AVAILABLE RESEARCH PROTOCOL: not elegible for COMET (recurrent disease)  ASSESSMENT: 55 y.o. [place], [place] woman  (1) status post left lumpectomy 2006 for ductal carcinoma in situ, estrogen and progesterone receptor positive  (2) status post adjuvant radiation to the left breast  (3) received tamoxifen for 5 years, completed April 2011  (4) status post left breast biopsy 09/24/2016 showing ductal carcinoma in situ, grade 2, estrogen and progesterone receptor positive  (5) left mastectomy with sentinel lymph node sampling planned  (6) immediate reconstruction with expander and latissimus flap, with eventual right uncoplasty planned  (7) consider prophylactic anti-estrogens  PLAN: We spent the better part of today's hour-long appointment discussing the biology of breast cancer in general, and the specifics of the patient's tumor in particular. Brandi Goodman understands that in noninvasive ductal carcinoma, also called ductal carcinoma in situ ("DCIS") the breast cancer cells remain trapped in the ducts were they started. They cannot travel to a vital organ. For that reason these cancers in themselves are not life-threatening.  If the whole breast is removed then all the ducts are removed and since the cancer cells are trapped in the ducts, the cure rate with mastectomy for noninvasive breast cancer is approximately 99%. Generally we recommend lumpectomy, because there is no survival advantage to mastectomy and because the cosmetic result is generally superior with breast conservation. However in Brandi Goodman's case, having already had radiation to the left breast, mastectomy is a better and safer option.   In estrogen receptor positive cancers like  Brandi Goodman's, anti-estrogens can also be considered. However they have no role in risk reduction as far as this cancer is concerned see is the expected cure rate is so high. Brandi Goodman can consider anti-estrogens for risk reduction of the possibility of a different cancer in the contralateral, right breast. However she did take tamoxifen for 5 years remotely, which is the standard of care for risk reduction in that setting. The additional benefit of another 5 years of anti-estrogens for this particular purpose is likely to be much less and not well documented.  Accordingly the overall plan is for surgery, with immediate implant and latissimus flap reconstruction. No further treatment will be needed for this cancer.  Generally I see patients like they be one more time after they have completed their local treatment, just to clarify any remaining questions. I anticipate she will be released from follow-up here after that visit.  Brandi Goodman has a good understanding of the overall plan. She agrees with it. She knows the goal of treatment in her case is cure. She will call with any problems that may develop before her next visit here.  Brandi Cruel, MD   10/09/2016 5:49 PM Medical Oncology and Hematology [facility] [address] [place], [place] [address]  Tel. [phone]    Fax. [phone]

## 2016-10-30 ENCOUNTER — Other Ambulatory Visit (HOSPITAL_COMMUNITY): Payer: 59

## 2016-11-01 ENCOUNTER — Encounter (HOSPITAL_COMMUNITY)
Admission: RE | Admit: 2016-11-01 | Discharge: 2016-11-01 | Disposition: A | Discharge status: Home / Self Care | Payer: 59 | Source: Ambulatory Visit | Attending: General Surgery | Admitting: General Surgery

## 2016-11-01 ENCOUNTER — Encounter (HOSPITAL_COMMUNITY): Payer: Self-pay

## 2016-11-01 DIAGNOSIS — C50912 Malignant neoplasm of unspecified site of left female breast: Secondary | ICD-10-CM | POA: Diagnosis not present

## 2016-11-01 DIAGNOSIS — Z01818 Encounter for other preprocedural examination: Secondary | ICD-10-CM | POA: Insufficient documentation

## 2016-11-01 HISTORY — DX: Family history of other specified conditions: Z84.89

## 2016-11-01 HISTORY — DX: Anxiety disorder, unspecified: F41.9

## 2016-11-01 LAB — BASIC METABOLIC PANEL
ANION GAP: 8 (ref 5–15)
BUN: 12 mg/dL (ref 6–20)
CALCIUM: 9.9 mg/dL (ref 8.9–10.3)
CO2: 28 mmol/L (ref 22–32)
Chloride: 104 mmol/L (ref 101–111)
Creatinine, Ser: 1.28 mg/dL — ABNORMAL HIGH (ref 0.44–1.00)
GFR, EST AFRICAN AMERICAN: 53 mL/min — AB (ref >60)
GFR, EST NON AFRICAN AMERICAN: 46 mL/min — AB (ref >60)
GLUCOSE: 97 mg/dL (ref 65–99)
Potassium: 4 mmol/L (ref 3.5–5.1)
Sodium: 140 mmol/L (ref 135–145)

## 2016-11-01 LAB — CBC
HEMATOCRIT: 41.9 % (ref 36.0–46.0)
Hemoglobin: 13.5 g/dL (ref 12.0–15.0)
MCH: 31.8 pg (ref 26.0–34.0)
MCHC: 32.2 g/dL (ref 30.0–36.0)
MCV: 98.8 fL (ref 78.0–100.0)
PLATELETS: 261 10*3/uL (ref 150–400)
RBC: 4.24 MIL/uL (ref 3.87–5.11)
RDW: 13.8 % (ref 11.5–15.5)
WBC: 5 10*3/uL (ref 4.0–10.5)

## 2016-11-01 NOTE — Progress Notes (Addendum)
Denies any cardiac issues, no murmurs, no dysrhymias.  Hasn't seen a cardio. Original cancer diagnosis was back in 2006.  Only had radiation, since then she goes for yearly mammograms As teenager, had goiter and was placed on thyroid meds, but she's had no meds for 2 yrs now. See sees Laurin Coder PA at [facility].  Hasn't seen her in [place] of years. Oncologist is Dr. Jana Hakim LOV 10/09/2016 No orders from Dr. Marla Roe at PAT appt.  Reconstruction consent will be done DOS Spoke with [facility] office, stating we need orders from Dr. Marla Roe. 12:51pm No orders received from Dr. Eusebio Friendly office.

## 2016-11-01 NOTE — Pre-Procedure Instructions (Addendum)
    AGATA LUCENTE  11/01/2016      [id number] - [address], [place] [address] Phone: [phone] Fax: [phone]    Your procedure is scheduled on Monday, June 4th   Report to [facility] Admitting at 5:30 AM             (posted surgery time 7:30 am - 1:40 pm)   Call this number if you have problems the morning of surgery:  [phone], other questions [phone] Mon-Fri from 8am - 4pm   Remember:  Do not eat food or drink liquids after midnight Sunday   Take these medicines the morning of surgery with A SIP OF WATER : Zyrtec              4-5 days prior to surgery, STOP taking any vitamins, herbal supplements, anti-inflammatories              Please drink, 2 hours prior to arrival time the "Boost"   Do not wear jewelry, make-up or nail polish.  Do not wear lotions, powders,  perfumes, or deoderant.  Do not shave 48 hours prior to surgery.     Do not bring valuables to the hospital.  [facility] is not responsible for any belongings or valuables.  Contacts, dentures or bridgework may not be worn into surgery.  Leave your suitcase in the car.  After surgery it may be brought to your room.  For patients admitted to the hospital, discharge time will be determined by your treatment team.  Please read over the following fact sheets that you were given. Pain Booklet and Surgical Site Infection Prevention

## 2016-11-02 ENCOUNTER — Ambulatory Visit: Payer: Self-pay | Admitting: Plastic Surgery

## 2016-11-02 DIAGNOSIS — C50912 Malignant neoplasm of unspecified site of left female breast: Secondary | ICD-10-CM

## 2016-11-02 NOTE — H&P (Signed)
Brandi Goodman is an 56 y.o. female.   Chief Complaint: Left Breast Cancer HPI: The patient is a 56 y.o. yrs old wf here with her husband for pre operative history and physical prior to left breast reconstruction with planned immediate left latissimus flap with placement of tissue expander.  History:   She underwent left breast lumpectomy 12 years ago followed by radiation. For ductal carcinoma in situ, ER/PR positive.  She finished tamoxifen after 5 years.  She underwent a recent mammogram which showed a calcification at the old lumpectomy site.  The path showed ductal carcinoma in situ.  Her mother and paternal aunt have had breast cancer.   She is 5 feet 7 inches tall, weight is 155 pounds, preop bra= 34 C.  She is interested in a left mastectomy.  She has noticed some muscle twitching in the left breast.  There is obvious skin changes from the radiation.  She has been seen by Dr. Toth.  She has two children  Past Medical History:  Diagnosis Date  . Anxiety    just lately  . Cancer (HCC)    dx in 2005 left breast cancer  . Family history of adverse reaction to anesthesia    father had nausea & vomiting    Past Surgical History:  Procedure Laterality Date  . BREAST BIOPSY Left    Stereo   . BREAST LUMPECTOMY Left   . EYE SURGERY     od cataract removal & lens implant  . LAPAROSCOPIC OOPHERECTOMY     left ovary around 2008    Family History  Problem Relation Age of Onset  . Breast cancer Neg Hx    Social History:  reports that she has never smoked. She has never used smokeless tobacco. She reports that she does not drink alcohol or use drugs.  Allergies:  Allergies  Allergen Reactions  . No Known Allergies      (Not in a hospital admission)  Results for orders placed or performed during the hospital encounter of 11/01/16 (from the past 48 hour(s))  CBC     Status: None   Collection Time: 11/01/16  8:31 AM  Result Value Ref Range   WBC 5.0 4.0 - 10.5 K/uL   RBC 4.24  3.87 - 5.11 MIL/uL   Hemoglobin 13.5 12.0 - 15.0 g/dL   HCT 41.9 36.0 - 46.0 %   MCV 98.8 78.0 - 100.0 fL   MCH 31.8 26.0 - 34.0 pg   MCHC 32.2 30.0 - 36.0 g/dL   RDW 13.8 11.5 - 15.5 %   Platelets 261 150 - 400 K/uL  Basic metabolic panel     Status: Abnormal   Collection Time: 11/01/16  8:31 AM  Result Value Ref Range   Sodium 140 135 - 145 mmol/L   Potassium 4.0 3.5 - 5.1 mmol/L   Chloride 104 101 - 111 mmol/L   CO2 28 22 - 32 mmol/L   Glucose, Bld 97 65 - 99 mg/dL   BUN 12 6 - 20 mg/dL   Creatinine, Ser 1.28 (H) 0.44 - 1.00 mg/dL   Calcium 9.9 8.9 - 10.3 mg/dL   GFR calc non Af Amer 46 (L) >60 mL/min   GFR calc Af Amer 53 (L) >60 mL/min    Comment: (NOTE) The eGFR has been calculated using the CKD EPI equation. This calculation has not been validated in all clinical situations. eGFR's persistently <60 mL/min signify possible Chronic Kidney Disease.    Anion gap 8 5 -   15   No results found.  Review of Systems  Constitutional: Negative.   HENT: Negative.   Eyes: Negative.   Respiratory: Negative.   Cardiovascular: Negative.   Gastrointestinal: Negative.   Genitourinary: Negative.   Musculoskeletal: Negative.   Skin: Negative.   Neurological: Negative.   Psychiatric/Behavioral: Negative.     There were no vitals taken for this visit. Physical Exam  Constitutional: She is oriented to person, place, and time. She appears well-developed and well-nourished.  HENT:  Head: Normocephalic and atraumatic.  Eyes: EOM are normal. Pupils are equal, round, and reactive to light.  Cardiovascular: Normal rate.   Respiratory: Effort normal. No respiratory distress.  GI: Soft. She exhibits no distension.  Neurological: She is alert and oriented to person, place, and time.  Skin: Skin is warm. No rash noted. No erythema.  Psychiatric: She has a normal mood and affect. Her behavior is normal. Judgment and thought content normal.     Assessment/Plan Plan immediate left breast  reconstruction utilizing left latissimus flap and tissue expander.  The risks that can be encountered with and after latissimus musculocutaneous flap graft and  placement of a breast expander placement were discussed and include the following but not limited to these: bleeding, infection, delayed healing, anesthesia risks, skin sensation changes, injury to structures including nerves, blood vessels, and muscles which may be temporary or permanent, allergies to tape, suture materials and glues, blood products, topical preparations or injected agents, skin contour irregularities, skin discoloration and swelling, deep vein thrombosis, cardiac and pulmonary complications, pain, which may persist, fluid accumulation, wrinkling of the skin over the expander,breast cancer recurrence, partial or complete flap loss, changes in nipple or breast sensation, expander leakage or rupture, faulty position of the expander, persistent pain, formation of tight scar tissue around the expander (capsular contracture), possible need for revisional surgery or staged procedures. The patient desires to proceed and consent was obtained.       CLAIRE S DILLINGHAM, DO 11/02/2016, 1:48 PM   

## 2016-11-05 ENCOUNTER — Inpatient Hospital Stay (HOSPITAL_COMMUNITY)
Admission: RE | Admit: 2016-11-05 | Discharge: 2016-11-07 | DRG: 581 | Disposition: A | Discharge status: Home / Self Care | Payer: 59 | Source: Ambulatory Visit | Attending: Plastic Surgery | Admitting: Plastic Surgery

## 2016-11-05 ENCOUNTER — Encounter (HOSPITAL_COMMUNITY)
Admission: RE | Admit: 2016-11-05 | Discharge: 2016-11-05 | Disposition: A | Discharge status: Home / Self Care | Payer: 59 | Source: Ambulatory Visit | Attending: General Surgery | Admitting: General Surgery

## 2016-11-05 ENCOUNTER — Encounter (HOSPITAL_COMMUNITY)
Admission: RE | Disposition: A | Discharge status: Home / Self Care | Payer: Self-pay | Source: Ambulatory Visit | Attending: Plastic Surgery

## 2016-11-05 ENCOUNTER — Inpatient Hospital Stay (HOSPITAL_COMMUNITY): Payer: 59 | Admitting: Certified Registered Nurse Anesthetist

## 2016-11-05 ENCOUNTER — Encounter (HOSPITAL_COMMUNITY): Payer: Self-pay | Admitting: Certified Registered Nurse Anesthetist

## 2016-11-05 ENCOUNTER — Inpatient Hospital Stay (HOSPITAL_COMMUNITY): Payer: 59

## 2016-11-05 DIAGNOSIS — F419 Anxiety disorder, unspecified: Secondary | ICD-10-CM | POA: Diagnosis present

## 2016-11-05 DIAGNOSIS — C50919 Malignant neoplasm of unspecified site of unspecified female breast: Secondary | ICD-10-CM | POA: Diagnosis present

## 2016-11-05 DIAGNOSIS — C50912 Malignant neoplasm of unspecified site of left female breast: Secondary | ICD-10-CM

## 2016-11-05 DIAGNOSIS — D0512 Intraductal carcinoma in situ of left breast: Secondary | ICD-10-CM

## 2016-11-05 DIAGNOSIS — Z170 Estrogen receptor positive status [ER+]: Secondary | ICD-10-CM

## 2016-11-05 DIAGNOSIS — Z803 Family history of malignant neoplasm of breast: Secondary | ICD-10-CM

## 2016-11-05 DIAGNOSIS — Z923 Personal history of irradiation: Secondary | ICD-10-CM

## 2016-11-05 DIAGNOSIS — Z419 Encounter for procedure for purposes other than remedying health state, unspecified: Secondary | ICD-10-CM

## 2016-11-05 HISTORY — PX: MASTECTOMY W/ SENTINEL NODE BIOPSY: SHX2001

## 2016-11-05 HISTORY — PX: BREAST RECONSTRUCTION WITH PLACEMENT OF TISSUE EXPANDER AND FLEX HD (ACELLULAR HYDRATED DERMIS): SHX6295

## 2016-11-05 SURGERY — MASTECTOMY WITH SENTINEL LYMPH NODE BIOPSY
Anesthesia: General | Site: Chest | Laterality: Left

## 2016-11-05 MED ORDER — PHENYLEPHRINE 40 MCG/ML (10ML) SYRINGE FOR IV PUSH (FOR BLOOD PRESSURE SUPPORT)
PREFILLED_SYRINGE | INTRAVENOUS | Status: DC | PRN
  Administered 2016-11-05 (×4): 80 ug via INTRAVENOUS

## 2016-11-05 MED ORDER — CHLORHEXIDINE GLUCONATE CLOTH 2 % EX PADS: 6.0000 | MEDICATED_PAD | Freq: Once | CUTANEOUS | Status: DC

## 2016-11-05 MED ORDER — EVICEL 5 ML EX KIT
PACK | CUTANEOUS | Status: DC | PRN
  Administered 2016-11-05: 5 mL

## 2016-11-05 MED ORDER — ACETAMINOPHEN 500 MG PO TABS
1000.0000 mg | ORAL_TABLET | ORAL | Status: AC
  Administered 2016-11-05: 1000 mg via ORAL
  Filled 2016-11-05: qty 2

## 2016-11-05 MED ORDER — ROCURONIUM BROMIDE 100 MG/10ML IV SOLN: INTRAVENOUS | Status: DC | PRN

## 2016-11-05 MED ORDER — FENTANYL CITRATE (PF) 100 MCG/2ML IJ SOLN
INTRAMUSCULAR | Status: AC
  Filled 2016-11-05: qty 2

## 2016-11-05 MED ORDER — TECHNETIUM TC 99M SULFUR COLLOID FILTERED
1.0000 | Freq: Once | INTRAVENOUS | Status: AC | PRN
  Administered 2016-11-05: 1 via INTRADERMAL

## 2016-11-05 MED ORDER — PROPOFOL 10 MG/ML IV BOLUS
INTRAVENOUS | Status: AC
  Filled 2016-11-05: qty 20

## 2016-11-05 MED ORDER — ONDANSETRON HCL 4 MG/2ML IJ SOLN
INTRAMUSCULAR | Status: AC
  Filled 2016-11-05: qty 2

## 2016-11-05 MED ORDER — DIPHENHYDRAMINE HCL 50 MG/ML IJ SOLN: 12.5000 mg | Freq: Four times a day (QID) | INTRAMUSCULAR | Status: DC | PRN

## 2016-11-05 MED ORDER — ZOLPIDEM TARTRATE 5 MG PO TABS
5.0000 mg | ORAL_TABLET | Freq: Every evening | ORAL | Status: DC | PRN
  Filled 2016-11-05: qty 1

## 2016-11-05 MED ORDER — 0.9 % SODIUM CHLORIDE (POUR BTL) OPTIME
TOPICAL | Status: DC | PRN
  Administered 2016-11-05: 2000 mL

## 2016-11-05 MED ORDER — KETOROLAC TROMETHAMINE 30 MG/ML IJ SOLN
INTRAMUSCULAR | Status: AC
  Filled 2016-11-05: qty 1

## 2016-11-05 MED ORDER — HYDROMORPHONE HCL 1 MG/ML IJ SOLN
0.2500 mg | INTRAMUSCULAR | Status: DC | PRN
  Administered 2016-11-05: 0.5 mg via INTRAVENOUS

## 2016-11-05 MED ORDER — PROMETHAZINE HCL 25 MG/ML IJ SOLN: 6.2500 mg | INTRAMUSCULAR | Status: DC | PRN

## 2016-11-05 MED ORDER — LACTATED RINGERS IV SOLN
INTRAVENOUS | Status: DC
  Administered 2016-11-05: 07:00:00 via INTRAVENOUS

## 2016-11-05 MED ORDER — SUGAMMADEX SODIUM 200 MG/2ML IV SOLN
INTRAVENOUS | Status: AC
  Filled 2016-11-05: qty 2

## 2016-11-05 MED ORDER — GABAPENTIN 300 MG PO CAPS
300.0000 mg | ORAL_CAPSULE | ORAL | Status: AC
  Administered 2016-11-05: 300 mg via ORAL
  Filled 2016-11-05: qty 1

## 2016-11-05 MED ORDER — ROPIVACAINE HCL 5 MG/ML IJ SOLN
INTRAMUSCULAR | Status: DC | PRN
  Administered 2016-11-05: 30 mL via PERINEURAL

## 2016-11-05 MED ORDER — ONDANSETRON 4 MG PO TBDP: 4.0000 mg | ORAL_TABLET | Freq: Four times a day (QID) | ORAL | Status: DC | PRN

## 2016-11-05 MED ORDER — KETOROLAC TROMETHAMINE 30 MG/ML IJ SOLN
30.0000 mg | Freq: Once | INTRAMUSCULAR | Status: DC | PRN
  Administered 2016-11-05: 30 mg via INTRAVENOUS

## 2016-11-05 MED ORDER — EVICEL 5 ML EX KIT
PACK | CUTANEOUS | Status: AC
  Filled 2016-11-05: qty 1

## 2016-11-05 MED ORDER — PHENYLEPHRINE 40 MCG/ML (10ML) SYRINGE FOR IV PUSH (FOR BLOOD PRESSURE SUPPORT)
PREFILLED_SYRINGE | INTRAVENOUS | Status: AC
  Filled 2016-11-05: qty 10

## 2016-11-05 MED ORDER — DIPHENHYDRAMINE HCL 12.5 MG/5ML PO ELIX: 12.5000 mg | ORAL_SOLUTION | Freq: Four times a day (QID) | ORAL | Status: DC | PRN

## 2016-11-05 MED ORDER — ARTIFICIAL TEARS OPHTHALMIC OINT
TOPICAL_OINTMENT | OPHTHALMIC | Status: DC | PRN
  Administered 2016-11-05: 1 via OPHTHALMIC

## 2016-11-05 MED ORDER — DEXTROSE 5 % IV SOLN
INTRAVENOUS | Status: DC | PRN
  Administered 2016-11-05: 20 ug/min via INTRAVENOUS

## 2016-11-05 MED ORDER — MIDAZOLAM HCL 2 MG/2ML IJ SOLN
1.0000 mg | Freq: Once | INTRAMUSCULAR | Status: AC
  Administered 2016-11-05: 1 mg via INTRAVENOUS

## 2016-11-05 MED ORDER — CELECOXIB 200 MG PO CAPS
400.0000 mg | ORAL_CAPSULE | ORAL | Status: AC
  Administered 2016-11-05: 400 mg via ORAL
  Filled 2016-11-05: qty 2

## 2016-11-05 MED ORDER — ROCURONIUM BROMIDE 10 MG/ML (PF) SYRINGE
PREFILLED_SYRINGE | INTRAVENOUS | Status: DC | PRN
  Administered 2016-11-05: 60 mg via INTRAVENOUS
  Administered 2016-11-05: 20 mg via INTRAVENOUS
  Administered 2016-11-05: 10 mg via INTRAVENOUS

## 2016-11-05 MED ORDER — NAPROXEN 250 MG PO TABS: 500.0000 mg | ORAL_TABLET | Freq: Two times a day (BID) | ORAL | Status: DC | PRN

## 2016-11-05 MED ORDER — ONDANSETRON HCL 4 MG/2ML IJ SOLN: 4.0000 mg | Freq: Four times a day (QID) | INTRAMUSCULAR | Status: DC | PRN

## 2016-11-05 MED ORDER — MIDAZOLAM HCL 2 MG/2ML IJ SOLN
INTRAMUSCULAR | Status: AC
  Filled 2016-11-05: qty 2

## 2016-11-05 MED ORDER — POLYETHYLENE GLYCOL 3350 17 G PO PACK: 17.0000 g | PACK | Freq: Every day | ORAL | Status: DC | PRN

## 2016-11-05 MED ORDER — LIDOCAINE 2% (20 MG/ML) 5 ML SYRINGE
INTRAMUSCULAR | Status: AC
  Filled 2016-11-05: qty 5

## 2016-11-05 MED ORDER — ONDANSETRON HCL 4 MG/2ML IJ SOLN
INTRAMUSCULAR | Status: DC | PRN
  Administered 2016-11-05: 4 mg via INTRAVENOUS

## 2016-11-05 MED ORDER — DEXAMETHASONE SODIUM PHOSPHATE 10 MG/ML IJ SOLN
INTRAMUSCULAR | Status: AC
  Filled 2016-11-05: qty 1

## 2016-11-05 MED ORDER — FENTANYL CITRATE (PF) 100 MCG/2ML IJ SOLN
INTRAMUSCULAR | Status: DC | PRN
  Administered 2016-11-05 (×2): 50 ug via INTRAVENOUS

## 2016-11-05 MED ORDER — SUGAMMADEX SODIUM 200 MG/2ML IV SOLN
INTRAVENOUS | Status: DC | PRN
  Administered 2016-11-05: 150 mg via INTRAVENOUS

## 2016-11-05 MED ORDER — SODIUM CHLORIDE 0.9 % IR SOLN
Status: DC | PRN
  Administered 2016-11-05: 500 mL

## 2016-11-05 MED ORDER — BISACODYL 10 MG RE SUPP: 10.0000 mg | Freq: Every day | RECTAL | Status: DC | PRN

## 2016-11-05 MED ORDER — BUPIVACAINE-EPINEPHRINE (PF) 0.25% -1:200000 IJ SOLN
INTRAMUSCULAR | Status: AC
  Filled 2016-11-05: qty 30

## 2016-11-05 MED ORDER — PROPOFOL 10 MG/ML IV BOLUS
INTRAVENOUS | Status: DC | PRN
  Administered 2016-11-05: 80 mg via INTRAVENOUS

## 2016-11-05 MED ORDER — ARTIFICIAL TEARS OPHTHALMIC OINT
TOPICAL_OINTMENT | OPHTHALMIC | Status: AC
  Filled 2016-11-05: qty 10.5

## 2016-11-05 MED ORDER — HYDROMORPHONE HCL 1 MG/ML IJ SOLN
INTRAMUSCULAR | Status: AC
  Administered 2016-11-05: 14:00:00
  Filled 2016-11-05: qty 0.5

## 2016-11-05 MED ORDER — CEFAZOLIN SODIUM-DEXTROSE 2-4 GM/100ML-% IV SOLN: 2.0000 g | INTRAVENOUS | Status: DC

## 2016-11-05 MED ORDER — FENTANYL CITRATE (PF) 100 MCG/2ML IJ SOLN
50.0000 ug | Freq: Once | INTRAMUSCULAR | Status: AC
  Administered 2016-11-05: 50 ug via INTRAVENOUS

## 2016-11-05 MED ORDER — SODIUM CHLORIDE 0.9 % IJ SOLN
INTRAMUSCULAR | Status: AC
  Filled 2016-11-05: qty 10

## 2016-11-05 MED ORDER — HYDROCODONE-ACETAMINOPHEN 5-325 MG PO TABS
1.0000 | ORAL_TABLET | ORAL | Status: DC | PRN
  Administered 2016-11-05 – 2016-11-07 (×7): 1 via ORAL
  Filled 2016-11-05 (×7): qty 1

## 2016-11-05 MED ORDER — LACTATED RINGERS IV SOLN
INTRAVENOUS | Status: DC | PRN
  Administered 2016-11-05 (×2): via INTRAVENOUS

## 2016-11-05 MED ORDER — EPHEDRINE 5 MG/ML INJ
INTRAVENOUS | Status: AC
  Filled 2016-11-05: qty 10

## 2016-11-05 MED ORDER — ROCURONIUM BROMIDE 10 MG/ML (PF) SYRINGE
PREFILLED_SYRINGE | INTRAVENOUS | Status: AC
  Filled 2016-11-05: qty 5

## 2016-11-05 MED ORDER — KCL IN DEXTROSE-NACL 20-5-0.45 MEQ/L-%-% IV SOLN
INTRAVENOUS | Status: AC
  Administered 2016-11-05: 13:00:00
  Filled 2016-11-05: qty 1000

## 2016-11-05 MED ORDER — FENTANYL CITRATE (PF) 250 MCG/5ML IJ SOLN
INTRAMUSCULAR | Status: AC
  Filled 2016-11-05: qty 5

## 2016-11-05 MED ORDER — CEFAZOLIN SODIUM-DEXTROSE 2-4 GM/100ML-% IV SOLN
2.0000 g | Freq: Three times a day (TID) | INTRAVENOUS | Status: DC
  Administered 2016-11-05 – 2016-11-07 (×6): 2 g via INTRAVENOUS
  Filled 2016-11-05 (×7): qty 100

## 2016-11-05 MED ORDER — METHYLENE BLUE 0.5 % INJ SOLN
INTRAVENOUS | Status: AC
  Filled 2016-11-05: qty 10

## 2016-11-05 MED ORDER — BUPIVACAINE HCL (PF) 0.25 % IJ SOLN
INTRAMUSCULAR | Status: DC | PRN
  Administered 2016-11-05: 6 mL

## 2016-11-05 MED ORDER — MORPHINE SULFATE (PF) 4 MG/ML IV SOLN: 2.0000 mg | INTRAVENOUS | Status: DC | PRN

## 2016-11-05 MED ORDER — GLYCOPYRROLATE 0.2 MG/ML IV SOSY
PREFILLED_SYRINGE | INTRAVENOUS | Status: DC | PRN
  Administered 2016-11-05: .2 mg via INTRAVENOUS

## 2016-11-05 MED ORDER — LIDOCAINE 2% (20 MG/ML) 5 ML SYRINGE
INTRAMUSCULAR | Status: DC | PRN
  Administered 2016-11-05: 60 mg via INTRAVENOUS

## 2016-11-05 MED ORDER — ACETAMINOPHEN 500 MG PO TABS
500.0000 mg | ORAL_TABLET | Freq: Four times a day (QID) | ORAL | Status: DC
  Administered 2016-11-05 – 2016-11-06 (×5): 500 mg via ORAL
  Filled 2016-11-05 (×5): qty 1

## 2016-11-05 MED ORDER — SENNA 8.6 MG PO TABS
1.0000 | ORAL_TABLET | Freq: Two times a day (BID) | ORAL | Status: DC
  Administered 2016-11-05 – 2016-11-07 (×5): 8.6 mg via ORAL
  Filled 2016-11-05 (×5): qty 1

## 2016-11-05 MED ORDER — LIDOCAINE HCL (CARDIAC) 20 MG/ML IV SOLN: INTRAVENOUS | Status: DC | PRN

## 2016-11-05 MED ORDER — DEXAMETHASONE SODIUM PHOSPHATE 10 MG/ML IJ SOLN
INTRAMUSCULAR | Status: DC | PRN
  Administered 2016-11-05: 10 mg via INTRAVENOUS

## 2016-11-05 MED ORDER — EPHEDRINE SULFATE-NACL 50-0.9 MG/10ML-% IV SOSY
PREFILLED_SYRINGE | INTRAVENOUS | Status: DC | PRN
  Administered 2016-11-05 (×2): 5 mg via INTRAVENOUS

## 2016-11-05 MED ORDER — CEFAZOLIN SODIUM-DEXTROSE 2-4 GM/100ML-% IV SOLN
2.0000 g | INTRAVENOUS | Status: AC
  Administered 2016-11-05: 2 g via INTRAVENOUS
  Filled 2016-11-05: qty 100

## 2016-11-05 MED ORDER — KCL IN DEXTROSE-NACL 20-5-0.45 MEQ/L-%-% IV SOLN
INTRAVENOUS | Status: DC
Start: 2016-11-05 — End: 2016-11-07
  Administered 2016-11-05 – 2016-11-06 (×4): via INTRAVENOUS
  Filled 2016-11-05 (×4): qty 1000

## 2016-11-05 MED ORDER — DIAZEPAM 2 MG PO TABS: 2.0000 mg | ORAL_TABLET | Freq: Two times a day (BID) | ORAL | Status: DC | PRN

## 2016-11-05 SURGICAL SUPPLY — 74 items
ADH SKN CLS APL DERMABOND .7 (GAUZE/BANDAGES/DRESSINGS) ×4
APPLIER CLIP 9.375 MED OPEN (MISCELLANEOUS) ×3
APR CLP MED 9.3 20 MLT OPN (MISCELLANEOUS) ×2
BAG DECANTER FOR FLEXI CONT (MISCELLANEOUS) ×3 IMPLANT
BINDER BREAST LRG (GAUZE/BANDAGES/DRESSINGS) ×1 IMPLANT
BINDER BREAST XLRG (GAUZE/BANDAGES/DRESSINGS) IMPLANT
BIOPATCH RED 1 DISK 7.0 (GAUZE/BANDAGES/DRESSINGS) ×9 IMPLANT
CANISTER SUCT 3000ML PPV (MISCELLANEOUS) ×6 IMPLANT
CHLORAPREP W/TINT 26ML (MISCELLANEOUS) ×7 IMPLANT
CLIP APPLIE 9.375 MED OPEN (MISCELLANEOUS) ×2 IMPLANT
CONT SPEC 4OZ CLIKSEAL STRL BL (MISCELLANEOUS) ×3 IMPLANT
COVER PROBE W GEL 5X96 (DRAPES) ×3 IMPLANT
COVER SURGICAL LIGHT HANDLE (MISCELLANEOUS) ×7 IMPLANT
DERMABOND ADVANCED (GAUZE/BANDAGES/DRESSINGS) ×2
DERMABOND ADVANCED .7 DNX12 (GAUZE/BANDAGES/DRESSINGS) ×4 IMPLANT
DEVICE DISSECT PLASMABLAD 3.0S (MISCELLANEOUS) IMPLANT
DRAIN CHANNEL 19F RND (DRAIN) ×7 IMPLANT
DRAPE CHEST BREAST 15X10 FENES (DRAPES) ×3 IMPLANT
DRAPE HALF SHEET 40X57 (DRAPES) ×2 IMPLANT
DRAPE INCISE IOBAN 66X45 STRL (DRAPES) ×1 IMPLANT
DRAPE ORTHO SPLIT 77X108 STRL (DRAPES) ×12
DRAPE SURG 17X23 STRL (DRAPES) ×12 IMPLANT
DRAPE SURG ORHT 6 SPLT 77X108 (DRAPES) ×4 IMPLANT
DRAPE WARM FLUID 44X44 (DRAPE) ×3 IMPLANT
DRSG MEPILEX BORDER 4X8 (GAUZE/BANDAGES/DRESSINGS) ×1 IMPLANT
DRSG PAD ABDOMINAL 8X10 ST (GAUZE/BANDAGES/DRESSINGS) ×3 IMPLANT
DRSG TEGADERM 4X4.75 (GAUZE/BANDAGES/DRESSINGS) ×3 IMPLANT
ELECT BLADE 4.0 EZ CLEAN MEGAD (MISCELLANEOUS) ×3
ELECT CAUTERY BLADE 6.4 (BLADE) ×3 IMPLANT
ELECT REM PT RETURN 9FT ADLT (ELECTROSURGICAL) ×6
ELECTRODE BLDE 4.0 EZ CLN MEGD (MISCELLANEOUS) IMPLANT
ELECTRODE REM PT RTRN 9FT ADLT (ELECTROSURGICAL) ×4 IMPLANT
EVACUATOR SILICONE 100CC (DRAIN) ×7 IMPLANT
GAUZE SPONGE 4X4 12PLY STRL (GAUZE/BANDAGES/DRESSINGS) ×3 IMPLANT
GLOVE BIO SURGEON STRL SZ 6.5 (GLOVE) ×8 IMPLANT
GLOVE BIO SURGEON STRL SZ7.5 (GLOVE) ×3 IMPLANT
GOWN STRL REUS W/ TWL LRG LVL3 (GOWN DISPOSABLE) ×8 IMPLANT
GOWN STRL REUS W/TWL LRG LVL3 (GOWN DISPOSABLE) ×21
IMPL BREAST 300CC (Breast) IMPLANT
IMPLANT BREAST 300CC (Breast) ×3 IMPLANT
KIT BASIN OR (CUSTOM PROCEDURE TRAY) ×5 IMPLANT
KIT ROOM TURNOVER OR (KITS) ×6 IMPLANT
LIGHT WAVEGUIDE WIDE FLAT (MISCELLANEOUS) IMPLANT
NDL 18GX1X1/2 (RX/OR ONLY) (NEEDLE) IMPLANT
NDL FILTER BLUNT 18X1 1/2 (NEEDLE) IMPLANT
NDL HYPO 25GX1X1/2 BEV (NEEDLE) IMPLANT
NEEDLE 18GX1X1/2 (RX/OR ONLY) (NEEDLE) IMPLANT
NEEDLE FILTER BLUNT 18X 1/2SAF (NEEDLE)
NEEDLE FILTER BLUNT 18X1 1/2 (NEEDLE) IMPLANT
NEEDLE HYPO 25GX1X1/2 BEV (NEEDLE) ×3 IMPLANT
NS IRRIG 1000ML POUR BTL (IV SOLUTION) ×9 IMPLANT
PACK GENERAL/GYN (CUSTOM PROCEDURE TRAY) ×6 IMPLANT
PAD ABD 8X10 STRL (GAUZE/BANDAGES/DRESSINGS) ×6 IMPLANT
PAD ARMBOARD 7.5X6 YLW CONV (MISCELLANEOUS) ×6 IMPLANT
PIN SAFETY STERILE (MISCELLANEOUS) ×3 IMPLANT
PLASMABLADE 3.0S (MISCELLANEOUS) ×3
SET ASEPTIC TRANSFER (MISCELLANEOUS) ×1 IMPLANT
SPECIMEN JAR X LARGE (MISCELLANEOUS) ×3 IMPLANT
SUT ETHILON 3 0 FSL (SUTURE) ×3 IMPLANT
SUT MNCRL AB 4-0 PS2 18 (SUTURE) ×11 IMPLANT
SUT MON AB 3-0 SH 27 (SUTURE) ×18
SUT MON AB 3-0 SH27 (SUTURE) ×4 IMPLANT
SUT MON AB 5-0 PS2 18 (SUTURE) ×7 IMPLANT
SUT PDS AB 2-0 CT1 27 (SUTURE) ×12 IMPLANT
SUT PDS AB 3-0 SH 27 (SUTURE) ×1 IMPLANT
SUT SILK 4 0 PS 2 (SUTURE) ×5 IMPLANT
SUT VIC AB 3-0 54X BRD REEL (SUTURE) IMPLANT
SUT VIC AB 3-0 BRD 54 (SUTURE)
SUT VIC AB 3-0 SH 18 (SUTURE) ×3 IMPLANT
SYR CONTROL 10ML LL (SYRINGE) ×1 IMPLANT
TOWEL OR 17X24 6PK STRL BLUE (TOWEL DISPOSABLE) ×6 IMPLANT
TOWEL OR 17X26 10 PK STRL BLUE (TOWEL DISPOSABLE) ×5 IMPLANT
TRAY FOLEY W/METER SILVER 14FR (SET/KITS/TRAYS/PACK) ×1 IMPLANT
TUBE CONNECTING 12X1/4 (SUCTIONS) ×2 IMPLANT

## 2016-11-05 NOTE — Anesthesia Procedure Notes (Signed)
Procedure Name: Intubation Date/Time: 11/05/2016 8:35 AM Performed by: Garrison Columbus T Pre-anesthesia Checklist: Patient identified, Emergency Drugs available, Suction available and Patient being monitored Patient Re-evaluated:Patient Re-evaluated prior to inductionOxygen Delivery Method: Circle System Utilized Preoxygenation: Pre-oxygenation with 100% oxygen Intubation Type: IV induction Ventilation: Mask ventilation without difficulty and Oral airway inserted - appropriate to patient size Laryngoscope Size: Sabra Heck and 2 Grade View: Grade I Tube type: Oral Tube size: 7.5 mm Number of attempts: 1 Airway Equipment and Method: Stylet and Oral airway Placement Confirmation: ETT inserted through vocal cords under direct vision,  positive ETCO2 and breath sounds checked- equal and bilateral Secured at: 21 cm Tube secured with: Tape Dental Injury: Teeth and Oropharynx as per pre-operative assessment

## 2016-11-05 NOTE — Anesthesia Procedure Notes (Signed)
Anesthesia Procedure Image    

## 2016-11-05 NOTE — Anesthesia Preprocedure Evaluation (Addendum)
Anesthesia Evaluation  Patient identified by MRN, date of birth, ID band Patient awake    Reviewed: Allergy & Precautions, NPO status , Patient's Chart, lab work & pertinent test results  Airway Mallampati: II  TM Distance: >3 FB Neck ROM: Full    Dental no notable dental hx. (+) Dental Advisory Given, Teeth Intact   Pulmonary neg pulmonary ROS,    Pulmonary exam normal breath sounds clear to auscultation       Cardiovascular negative cardio ROS Normal cardiovascular exam Rhythm:Regular Rate:Normal     Neuro/Psych negative neurological ROS  negative psych ROS   GI/Hepatic negative GI ROS, Neg liver ROS,   Endo/Other  negative endocrine ROS  Renal/GU negative Renal ROS  negative genitourinary   Musculoskeletal negative musculoskeletal ROS (+)   Abdominal   Peds negative pediatric ROS (+)  Hematology negative hematology ROS (+)   Anesthesia Other Findings   Reproductive/Obstetrics negative OB ROS                            Anesthesia Physical Anesthesia Plan  ASA: II  Anesthesia Plan: General   Post-op Pain Management:  Regional for Post-op pain   Induction: Intravenous  Airway Management Planned: Oral ETT  Additional Equipment:   Intra-op Plan:   Post-operative Plan: Extubation in OR  Informed Consent: I have reviewed the patients History and Physical, chart, labs and discussed the procedure including the risks, benefits and alternatives for the proposed anesthesia with the patient or authorized representative who has indicated his/her understanding and acceptance.   Dental advisory given  Plan Discussed with: CRNA and Surgeon  Anesthesia Plan Comments:         Anesthesia Quick Evaluation

## 2016-11-05 NOTE — H&P (View-Only) (Signed)
Brandi Goodman is an 56 y.o. female.   Chief Complaint: Left Breast Cancer HPI: The patient is a 56 y.o. yrs old wf here with her husband for pre operative history and physical prior to left breast reconstruction with planned immediate left latissimus flap with placement of tissue expander.  History:   She underwent left breast lumpectomy 12 years ago followed by radiation. For ductal carcinoma in situ, ER/PR positive.  She finished tamoxifen after 5 years.  She underwent a recent mammogram which showed a calcification at the old lumpectomy site.  The path showed ductal carcinoma in situ.  Her mother and paternal aunt have had breast cancer.   She is 5 feet 7 inches tall, weight is 155 pounds, preop bra= 34 C.  She is interested in a left mastectomy.  She has noticed some muscle twitching in the left breast.  There is obvious skin changes from the radiation.  She has been seen by Dr. Marlou Starks.  She has two children  Past Medical History:  Diagnosis Date  . Anxiety    just lately  . Cancer [facility])    dx in 2005 left breast cancer  . Family history of adverse reaction to anesthesia    father had nausea & vomiting    Past Surgical History:  Procedure Laterality Date  . BREAST BIOPSY Left    Stereo   . BREAST LUMPECTOMY Left   . EYE SURGERY     od cataract removal & lens implant  . LAPAROSCOPIC OOPHERECTOMY     left ovary around 2008    Family History  Problem Relation Age of Onset  . Breast cancer Neg Hx    Social History:  reports that she has never smoked. She has never used smokeless tobacco. She reports that she does not drink alcohol or use drugs.  Allergies:  Allergies  Allergen Reactions  . No Known Allergies      (Not in a hospital admission)  Results for orders placed or performed during the hospital encounter of 11/01/16 (from the past 48 hour(s))  CBC     Status: None   Collection Time: 11/01/16  8:31 AM  Result Value Ref Range   WBC 5.0 4.0 - 10.5 K/uL   RBC 4.24  3.87 - 5.11 MIL/uL   Hemoglobin 13.5 12.0 - 15.0 g/dL   HCT 41.9 36.0 - 46.0 %   MCV 98.8 78.0 - 100.0 fL   MCH 31.8 26.0 - 34.0 pg   MCHC 32.2 30.0 - 36.0 g/dL   RDW 13.8 11.5 - 15.5 %   Platelets 261 150 - 400 K/uL  Basic metabolic panel     Status: Abnormal   Collection Time: 11/01/16  8:31 AM  Result Value Ref Range   Sodium 140 135 - 145 mmol/L   Potassium 4.0 3.5 - 5.1 mmol/L   Chloride 104 101 - 111 mmol/L   CO2 28 22 - 32 mmol/L   Glucose, Bld 97 65 - 99 mg/dL   BUN 12 6 - 20 mg/dL   Creatinine, Ser 1.28 (H) 0.44 - 1.00 mg/dL   Calcium 9.9 8.9 - 10.3 mg/dL   GFR calc non Af Amer 46 (L) >60 mL/min   GFR calc Af Amer 53 (L) >60 mL/min    Comment: (NOTE) The eGFR has been calculated using the CKD EPI equation. This calculation has not been validated in all clinical situations. eGFR's persistently <60 mL/min signify possible Chronic Kidney Disease.    Anion gap 8 5 -  15   No results found.  Review of Systems  Constitutional: Negative.   HENT: Negative.   Eyes: Negative.   Respiratory: Negative.   Cardiovascular: Negative.   Gastrointestinal: Negative.   Genitourinary: Negative.   Musculoskeletal: Negative.   Skin: Negative.   Neurological: Negative.   Psychiatric/Behavioral: Negative.     There were no vitals taken for this visit. Physical Exam  Constitutional: She is oriented to person, place, and time. She appears well-developed and well-nourished.  HENT:  Head: Normocephalic and atraumatic.  Eyes: EOM are normal. Pupils are equal, round, and reactive to light.  Cardiovascular: Normal rate.   Respiratory: Effort normal. No respiratory distress.  GI: Soft. She exhibits no distension.  Neurological: She is alert and oriented to person, place, and time.  Skin: Skin is warm. No rash noted. No erythema.  Psychiatric: She has a normal mood and affect. Her behavior is normal. Judgment and thought content normal.     Assessment/Plan Plan immediate left breast  reconstruction utilizing left latissimus flap and tissue expander.  The risks that can be encountered with and after latissimus musculocutaneous flap graft and  placement of a breast expander placement were discussed and include the following but not limited to these: bleeding, infection, delayed healing, anesthesia risks, skin sensation changes, injury to structures including nerves, blood vessels, and muscles which may be temporary or permanent, allergies to tape, suture materials and glues, blood products, topical preparations or injected agents, skin contour irregularities, skin discoloration and swelling, deep vein thrombosis, cardiac and pulmonary complications, pain, which may persist, fluid accumulation, wrinkling of the skin over the expander,breast cancer recurrence, partial or complete flap loss, changes in nipple or breast sensation, expander leakage or rupture, faulty position of the expander, persistent pain, formation of tight scar tissue around the expander (capsular contracture), possible need for revisional surgery or staged procedures. The patient desires to proceed and consent was obtained.       [name], DO 11/02/2016, 1:48 PM

## 2016-11-05 NOTE — Interval H&P Note (Signed)
History and Physical Interval Note:  11/05/2016 8:26 AM  Brandi Goodman  has presented today for surgery, with the diagnosis of RECURRENT LEFT BREAST CANCER  The various methods of treatment have been discussed with the patient and family. After consideration of risks, benefits and other options for treatment, the patient has consented to  Procedure(s): LEFT MASTECTOMY WITH SENTINEL LYMPH NODE BIOPSY (Left) IMMEDIATE BREAST RECONSTRUCTION WITH PLACEMENT OF TISSUE EXPANDER AND LATISSIMUS FLAP (Left) as a surgical intervention .  The patient's history has been reviewed, patient examined, no change in status, stable for surgery.  I have reviewed the patient's chart and labs.  Questions were answered to the patient's satisfaction.     [name]

## 2016-11-05 NOTE — H&P (Signed)
Brandi Goodman  Location: [facility] Surgery Patient #: [id number] DOB: Jul 31, 1960 Married / Language: English / Race: White Female   History of Present Illness  The patient is a 56 year old female who presents with breast cancer. We are asked to see the patient in consultation by Dr. Nolon Nations to evaluate her for left breast cancer. The patient is a 56 year old white female who is about 12 years status post left breast lumpectomy for ductal carcinoma in situ that was ER/PR positive. She finished radiation and took tamoxifen for 5 years. On her most recent follow-up mammogram she was found to have a new area of calcification at the old lumpectomy site. This was biopsied and came back as ductal carcinoma in situ. Her tumor markers are pending. She denies any breast pain. She denies any discharge from the nipple. She does have a significant family history of breast cancer in her mother and paternal aunt.   Past Surgical History  Breast Biopsy  Left. Breast Mass; Local Excision  Left. Cataract Surgery  Right. Oral Surgery  Sentinel Lymph Node Biopsy   Diagnostic Studies History  Colonoscopy  never Mammogram  within last year  Allergies  No Known Drug Allergies  Allergies Reconciled   Medication History  Multivitamin Adult (Oral) Active. Fish Oil + D3 (1000-1000MG -UNIT Capsule, Oral) Active. Medications Reconciled  Social History  Caffeine use  Tea. No alcohol use  No drug use  Tobacco use  Never smoker.  Family History  Arthritis  Father. Breast Cancer  Mother. Migraine Headache  Daughter.  Pregnancy / Birth History  Age at menarche  20 years. Age of menopause  [age] Contraceptive History  Oral contraceptives. Gravida  2 Irregular periods  Maternal age  16-25 Para  2  Other Problems  Breast Cancer  Oophorectomy  Left. Thyroid Disease     Review of Systems General Not Present- Appetite Loss, Chills, Fatigue,  Fever, Night Sweats, Weight Gain and Weight Loss. Skin Not Present- Change in Wart/Mole, Dryness, Hives, Jaundice, New Lesions, Non-Healing Wounds, Rash and Ulcer. HEENT Present- Wears glasses/contact lenses. Not Present- Earache, Hearing Loss, Hoarseness, Nose Bleed, Oral Ulcers, Ringing in the Ears, Seasonal Allergies, Sinus Pain, Sore Throat, Visual Disturbances and Yellow Eyes. Respiratory Not Present- Bloody sputum, Chronic Cough, Difficulty Breathing, Snoring and Wheezing. Breast Not Present- Breast Mass, Breast Pain, Nipple Discharge and Skin Changes. Cardiovascular Not Present- Chest Pain, Difficulty Breathing Lying Down, Leg Cramps, Palpitations, Rapid Heart Rate, Shortness of Breath and Swelling of Extremities. Gastrointestinal Not Present- Abdominal Pain, Bloating, Bloody Stool, Change in Bowel Habits, Chronic diarrhea, Constipation, Difficulty Swallowing, Excessive gas, Gets full quickly at meals, Hemorrhoids, Indigestion, Nausea, Rectal Pain and Vomiting. Female Genitourinary Not Present- Frequency, Nocturia, Painful Urination, Pelvic Pain and Urgency. Musculoskeletal Not Present- Back Pain, Joint Pain, Joint Stiffness, Muscle Pain, Muscle Weakness and Swelling of Extremities. Neurological Not Present- Decreased Memory, Fainting, Headaches, Numbness, Seizures, Tingling, Tremor, Trouble walking and Weakness. Psychiatric Not Present- Anxiety, Bipolar, Change in Sleep Pattern, Depression, Fearful and Frequent crying. Endocrine Not Present- Cold Intolerance, Excessive Hunger, Hair Changes, Heat Intolerance, Hot flashes and New Diabetes. Hematology Not Present- Blood Thinners, Easy Bruising, Excessive bleeding, Gland problems, HIV and Persistent Infections.  Vitals  Weight: 154.4 lb Height: 67in Body Surface Area: 1.81 m Body Mass Index: 24.18 kg/m  Temp.: 11F  Pulse: 68 (Regular)  BP: 120/70 (Sitting, Left Arm, Standard)       Physical Exam  General Mental  Status-Alert. General Appearance-Consistent with stated age. Hydration-Well hydrated.  Voice-Normal.  Head and Neck Head-normocephalic, atraumatic with no lesions or palpable masses. Trachea-midline. Thyroid Gland Characteristics - normal size and consistency.  Eye Eyeball - Bilateral-Extraocular movements intact. Sclera/Conjunctiva - Bilateral-No scleral icterus.  Chest and Lung Exam Chest and lung exam reveals -quiet, even and easy respiratory effort with no use of accessory muscles and on auscultation, normal breath sounds, no adventitious sounds and normal vocal resonance. Inspection Chest Wall - Normal. Back - normal.  Breast Note: There are 3 scars in the upper outer left breast as well as a small scar in the left axilla. There is no palpable mass in either breast. There is no palpable axillary, subclavicular, or cervical lymphadenopathy. She does have some dense breast tissue laterally on the right.   Cardiovascular Cardiovascular examination reveals -normal heart sounds, regular rate and rhythm with no murmurs and normal pedal pulses bilaterally.  Abdomen Inspection Inspection of the abdomen reveals - No Hernias. Skin - Scar - no surgical scars. Palpation/Percussion Palpation and Percussion of the abdomen reveal - Soft, Non Tender, No Rebound tenderness, No Rigidity (guarding) and No hepatosplenomegaly. Auscultation Auscultation of the abdomen reveals - Bowel sounds normal.  Neurologic Neurologic evaluation reveals -alert and oriented x 3 with no impairment of recent or remote memory. Mental Status-Normal.  Musculoskeletal Normal Exam - Left-Upper Extremity Strength Normal and Lower Extremity Strength Normal. Normal Exam - Right-Upper Extremity Strength Normal and Lower Extremity Strength Normal.  Lymphatic Head & Neck  General Head & Neck Lymphatics: Bilateral - Description - Normal. Axillary  General Axillary Region: Bilateral -  Description - Normal. Tenderness - Non Tender. Femoral & Inguinal  Generalized Femoral & Inguinal Lymphatics: Bilateral - Description - Normal. Tenderness - Non Tender.    Assessment & Plan  DUCTAL CARCINOMA IN SITU (DCIS) OF LEFT BREAST (D05.12) Impression: The patient is 12 years status post left breast lumpectomy for ductal carcinoma in situ. She now has a small area of recurrence in the left breast. I have talked to her in detail about the different options for treatment and at this point because she had previous radiation I think her most appropriate surgical intervention would be a mastectomy. We would try for a sentinel node mapping at the same time. I think she would also be a good candidate for immediate reconstruction if she is interested. I will go ahead and refer her to both plastic surgery and medical oncology. I will coordinate with plastic surgery if she decides to go with reconstruction. Otherwise I have discussed with her in detail the risks and benefits of the operation as well as some of the technical aspects and she understands and wishes to proceed Current Plans Referred to Oncology, for evaluation and follow up (Oncology). Routine. Referred to Surgery - Plastic, for evaluation and follow up (Plastic Surgery). Routine.

## 2016-11-05 NOTE — Op Note (Signed)
DATE OF OPERATION: 11/05/2016  LOCATION: [facility]  PREOPERATIVE DIAGNOSIS: Breast Cancer Left Breast   POSTOPERATIVE DIAGNOSIS: Same  PROCEDURE: 1. Latissimus myocutaneous flap  to reconstruct the Left breast CPT 19361 2. Tissue expander placement CPT 19357 with 100 cc of saline infused into the 300 cc expander  SURGEON: Claire Sanger Dillingham, DO  ASSISTANT: Shawn Rayburn, PA  EBL: 100 cc  SPECIMEN: None  DRAINS: 3 total 66 blake round drains  CONDITION: Stable  COMPLICATIONS: None  INDICATION: The patient, Brandi Goodman, is a 56 y.o. female born on [birth year], is here for treatment of immediate left breast cancer and immediate reconstruction after a left mastectomy. She had breast cancer in the past and underwent lumpectomy with radiation.    PROCEDURE DETAILS:  The patient was seen on the morning of her surgery and marked out for her flap.  She was given an IV and IV antibiotics. She was then taken to the operating room and given a general anesthetic. A standard time out was performed and all information was confirmed by those in the room. She has SCD's and a foley catheter placed. General surgery performed a left mastectomy and when done we were called into the room.  The patient was placed into the right lateral decubitus position with all key points padded. She was then re-prepped and draped in the standard sterile fashion using a chloroprep. The paddle design and position was confirmed. The procedure began by incising the margins of the paddle and dissecting out until all four margins of the muscle were identified. The muscle was then released inferiorly and anteriorly and care was taken not to pick up the serratus anteriorly or the paraspinous muscles posteriorly. The flap was then raised to the scapula and released and rotated medially. Care was taken to protect the vascular pedicle throughout this portion of the procedure. The paddle and muscle looked healthy  throughout the case.  The old mastectomy scar was excised and the flaps on top of the pectoralis muscle were raised.  The posterior and anterior pockets were then connected in the plane above the muscle. The muscle from the back and the skin paddle were then rotated into the chest pocket and covered with an ioban dressing. The flap pedicle was inspected and there was no tension. The back pocket was hemostased and Evicel placed. Two #19 blake round drains were place and secured with 3-0 Silk. The back incision was closed with buried 3-0 Vicryl, followed by 4-0 Monocryl and 5-0 Monocryl.  Dermabond and a protective dressing was applied.   The patient was then repositioned onto her back and the chest was prepped and draped with a betadine. The breast pocket was inspected and hemostases was achieved with electrocautery. The muscle was then secured superiorly to the pectoralis muscle with 3-0 Monocryl. A 300 cc expander was chosen. It was soaked in triple antibiotic solution and evacuated of air and then filled with 100 cc of sterile saline. The inferior portion of the muscle was tacked to the inframammary fold with 3-0 Monocryl.  One drain was placed on this side and secured with 3-0 Silk. The flap was then closed with 3-0 Monocryl deep, followed by 4-0 Monocryl and the skin closed with 5-0 Monocryl.  Dermabond, ABDs and a breast binder was applied.    The patient was allowed to wake up and taken to recovery room in stable condition at the end of the case. The family was notified at the end of the case.

## 2016-11-05 NOTE — Interval H&P Note (Signed)
History and Physical Interval Note:  11/05/2016 8:17 AM  Brandi Goodman  has presented today for surgery, with the diagnosis of RECURRENT LEFT BREAST CANCER  The various methods of treatment have been discussed with the patient and family. After consideration of risks, benefits and other options for treatment, the patient has consented to  Procedure(s): LEFT MASTECTOMY WITH SENTINEL LYMPH NODE BIOPSY (Left) IMMEDIATE BREAST RECONSTRUCTION WITH PLACEMENT OF TISSUE EXPANDER AND LATISSIMUS FLAP (Left) as a surgical intervention .  The patient's history has been reviewed, patient examined, no change in status, stable for surgery.  I have reviewed the patient's chart and labs.  Questions were answered to the patient's satisfaction.     TOTH III,PAUL S

## 2016-11-05 NOTE — Progress Notes (Signed)
Patient arrived to 6n11, alert and oriented, slightly drowsy, minimal pain. She is noted to have 3 JP drains near the left chest area, an incision with skin glue on left breast, ABD on top, breast binder over that and a foam dressing to left back/flank area. IV fluids running, VSS, oriented to room and staff, will continue to monitor. Family at bedside.

## 2016-11-05 NOTE — Transfer of Care (Signed)
Immediate Anesthesia Transfer of Care Note  Patient: Brandi Goodman  Procedure(s) Performed: Procedure(s): LEFT MASTECTOMY WITH SENTINEL LYMPH NODE BIOPSY (Left) IMMEDIATE BREAST RECONSTRUCTION WITH PLACEMENT OF TISSUE EXPANDER AND LATISSIMUS FLAP (Left)  Patient Location: PACU  Anesthesia Type:General and Regional  Level of Consciousness: awake, alert  and oriented  Airway & Oxygen Therapy: Patient Spontanous Breathing  Post-op Assessment: Report given to RN, Post -op Vital signs reviewed and stable and Patient moving all extremities X 4  Post vital signs: Reviewed and stable  Last Vitals:  Vitals:   11/05/16 0815 11/05/16 1231  BP: 90/64 (!) 90/53  Pulse: 62 61  Resp: 10 (!) 8  Temp:      Last Pain:  Vitals:   11/05/16 0657  TempSrc: Oral         Complications: No apparent anesthesia complications

## 2016-11-05 NOTE — Op Note (Signed)
11/05/2016  12:15 PM  PATIENT:  Brandi Goodman  56 y.o. female  PRE-OPERATIVE DIAGNOSIS:  RECURRENT LEFT BREAST CANCER  POST-OPERATIVE DIAGNOSIS:  RECURRENT LEFT BREAST CANCER  PROCEDURE:  Procedure(s): LEFT MASTECTOMY WITH SENTINEL LYMPH NODE BIOPSY (Left)   SURGEON:  Surgeon(s) and Role: Panel 1:    * [name], MD - Primary  Panel 2:    * Dillingham, Loel Lofty, DO - Primary  PHYSICIAN ASSISTANT:   ASSISTANTS: Sharyn Dross, RNFA   ANESTHESIA:   general  EBL:  Total I/O In: 1700 [I.V.:1700] Out: 30 [Urine:655; Blood:30]  BLOOD ADMINISTERED:none  DRAINS: none   LOCAL MEDICATIONS USED:  NONE  SPECIMEN:  Source of Specimen:  left mastectomy and sentinel node  DISPOSITION OF SPECIMEN:  PATHOLOGY  COUNTS:  YES  TOURNIQUET:  * No tourniquets in log *  DICTATION: .Dragon Dictation   After informed consent was obtained the patient was brought to the operating room and placed in the supine position on the operating room table. After adequate induction of general anesthesia the patient's bilateral chest, breast, and axillary areas were prepped with ChloraPrep, allowed to dry, and draped in usual sterile manner. An appropriate timeout was performed. An elliptical incision was then made around the nipple and areola complex on the left breast in order to spare as much skin as possible. The incision was carried through the skin and subcutaneous tissue sharply with the plasma blade. Rest looks were then used to elevate the skin flaps anteriorly towards the ceiling. Thin skin flaps were then created circumferentially between the breast tissue and the subcutaneous fat. This dissection was carried out circumferentially all the way to the chest wall. Next the breast was removed from the pectoralis muscle with the pectoralis fascia also sharply with the plasma blade. Once the breast was removed it was oriented with a stitch on the lateral skin and sent to pathology for further  evaluation. The neoprobe was set to technetium and an area of radioactivity was identified in the left axilla. This area was excised sharply with the plasma blade and sent as sentinel node #1. No other radioactivity or palpable lymph nodes were identified in the left axilla. The wound was then irrigated with copious amounts of saline. Hemostasis was achieved using the plasma blade. A moistened lap sponge was then placed in the cavity. The cavity was then covered with a Ioban drape. At this point the operation was turned over to Dr. Marla Roe for the reconstruction portion of the operation. Her portion will be dictated separately. The patient had tolerated the procedure well. The patient was still asleep and in stable condition. All needle sponge and instrument counts were correct.  PLAN OF CARE: Admit for overnight observation  PATIENT DISPOSITION:  PACU - hemodynamically stable.   Delay start of Pharmacological VTE agent (>24hrs) due to surgical blood loss or risk of bleeding: no

## 2016-11-05 NOTE — Anesthesia Procedure Notes (Addendum)
Anesthesia Regional Block: Pectoralis block   Pre-Anesthetic Checklist: ,, timeout performed, Correct Patient, Correct Site, Correct Laterality, Correct Procedure, Correct Position, site marked, Risks and benefits discussed,  Surgical consent,  Pre-op evaluation,  At surgeon's request and post-op pain management  Laterality: Left  Prep: chloraprep       Needles:  Injection technique: Single-shot  Needle Type: Echogenic Needle     Needle Length: 9cm      Additional Needles:   Procedures: ultrasound guided,,,,,,,,  Narrative:  Start time: 11/05/2016 8:02 AM End time: 11/05/2016 8:12 AM Injection made incrementally with aspirations every 5 mL.  Performed by: Personally  Anesthesiologist: Shaunae Sieloff  Additional Notes: Patient tolerated the procedure well without complications

## 2016-11-05 NOTE — Anesthesia Postprocedure Evaluation (Signed)
Anesthesia Post Note  Patient: Brandi Goodman  Procedure(s) Performed: Procedure(s) (LRB): LEFT MASTECTOMY WITH SENTINEL LYMPH NODE BIOPSY (Left) IMMEDIATE BREAST RECONSTRUCTION WITH PLACEMENT OF TISSUE EXPANDER AND LATISSIMUS FLAP (Left)     Patient location during evaluation: PACU Anesthesia Type: General and Regional Level of consciousness: awake and alert Pain management: pain level controlled Vital Signs Assessment: post-procedure vital signs reviewed and stable Respiratory status: spontaneous breathing, nonlabored ventilation, respiratory function stable and patient connected to nasal cannula oxygen Cardiovascular status: blood pressure returned to baseline and stable Postop Assessment: no signs of nausea or vomiting Anesthetic complications: no    Last Vitals:  Vitals:   11/05/16 1330 11/05/16 1340  BP: 91/61   Pulse: (!) 50 (!) 52  Resp: 11 (!) 7  Temp:      Last Pain:  Vitals:   11/05/16 1340  TempSrc:   PainSc: Asleep                 Alishah Schulte S

## 2016-11-06 ENCOUNTER — Encounter (HOSPITAL_COMMUNITY): Payer: Self-pay | Admitting: General Surgery

## 2016-11-06 LAB — CBC
HCT: 31.5 % — ABNORMAL LOW (ref 36.0–46.0)
Hemoglobin: 10 g/dL — ABNORMAL LOW (ref 12.0–15.0)
MCH: 31.3 pg (ref 26.0–34.0)
MCHC: 31.7 g/dL (ref 30.0–36.0)
MCV: 98.4 fL (ref 78.0–100.0)
Platelets: 230 10*3/uL (ref 150–400)
RBC: 3.2 MIL/uL — ABNORMAL LOW (ref 3.87–5.11)
RDW: 13.8 % (ref 11.5–15.5)
WBC: 10 10*3/uL (ref 4.0–10.5)

## 2016-11-06 LAB — BASIC METABOLIC PANEL
ANION GAP: 8 (ref 5–15)
BUN: 8 mg/dL (ref 6–20)
CALCIUM: 8.6 mg/dL — AB (ref 8.9–10.3)
CO2: 24 mmol/L (ref 22–32)
CREATININE: 1.18 mg/dL — AB (ref 0.44–1.00)
Chloride: 105 mmol/L (ref 101–111)
GFR calc Af Amer: 59 mL/min — ABNORMAL LOW (ref >60)
GFR, EST NON AFRICAN AMERICAN: 51 mL/min — AB (ref >60)
GLUCOSE: 118 mg/dL — AB (ref 65–99)
Potassium: 4 mmol/L (ref 3.5–5.1)
Sodium: 137 mmol/L (ref 135–145)

## 2016-11-06 LAB — HIV ANTIBODY (ROUTINE TESTING W REFLEX): HIV SCREEN 4TH GENERATION: NONREACTIVE

## 2016-11-06 NOTE — Progress Notes (Signed)
1 Day Post-Op   Subjective/Chief Complaint: Reports pain over the back and sternal areas especially. BP has been running a little low as well, but she has needed pain medications for above. Will watch overnight.  Left latissimus flap viable and without signs of hematoma. JP drainage serosanguinous and amounts as expected.    Objective: Vital signs in last 24 hours: Temp:  [97.4 F (36.3 C)-98.3 F (36.8 C)] 98.3 F (36.8 C) (06/05 1402) Pulse Rate:  [54-76] 60 (06/05 1402) Resp:  [9-19] 18 (06/05 1402) BP: (80-99)/(50-63) 99/62 (06/05 1402) SpO2:  [98 %-100 %] 100 % (06/05 1402) Last BM Date: 11/05/16  Intake/Output from previous day: 06/04 0701 - 06/05 0700 In: 3830 [P.O.:360; I.V.:3200; IV Piggyback:200] Out: 880 [Urine:655; Drains:195; Blood:30] Intake/Output this shift: Total I/O In: 340 [P.O.:340] Out: 55 [Drains:55]    Lab Results:   Recent Labs  11/06/16 0519  WBC 10.0  HGB 10.0*  HCT 31.5*  PLT 230   BMET  Recent Labs  11/06/16 0519  NA 137  K 4.0  CL 105  CO2 24  GLUCOSE 118*  BUN 8  CREATININE 1.18*  CALCIUM 8.6*   PT/INR No results for input(s): LABPROT, INR in the last 72 hours. ABG No results for input(s): PHART, HCO3 in the last 72 hours.  Invalid input(s): PCO2, PO2  Studies/Results: Dg Chest Portable 1 View  Result Date: 11/05/2016 CLINICAL DATA:  Missing needle count. EXAM: PORTABLE CHEST 1 VIEW COMPARISON:  Radiographs of April 26, 2004. FINDINGS: The heart size and mediastinal contours are within normal limits. Both lungs are clear. The visualized skeletal structures are unremarkable. Endotracheal tube is in grossly good position. Probable surgical clips seen in the left axillary region as well as overlying the left lateral chest wall. No definite curvilinear density is seen to suggest a curved needle. Soft tissue gas is noted in the left breast tissues as well as surgical drain. IMPRESSION: Postsurgical changes are seen involving  the left breast. Linear metallic densities are seen the left axillary region and the soft tissues overlying the left lateral chest wall most consistent surgical clips. No curvilinear metallic density is seen to suggest retained needle. These results were called by telephone at the time of interpretation on 11/05/2016 at 12:37 pm to Kendell Bane, the patient's nurse, who verbally acknowledged these results. Electronically Signed   By: Marijo Conception, M.D.   On: 11/05/2016 12:38    Anti-infectives: Anti-infectives    Start     Dose/Rate Route Frequency Ordered Stop   11/05/16 1630  ceFAZolin (ANCEF) IVPB 2g/100 mL premix     2 g 200 mL/hr over 30 Minutes Intravenous Every 8 hours 11/05/16 1530     11/05/16 0906  polymyxin B 500,000 Units, bacitracin 50,000 Units in sodium chloride irrigation 0.9 % 500 mL irrigation  Status:  Discontinued       As needed 11/05/16 0906 11/05/16 1231   11/05/16 0651  ceFAZolin (ANCEF) IVPB 2g/100 mL premix  Status:  Discontinued     2 g 200 mL/hr over 30 Minutes Intravenous On call to O.R. 11/05/16 6010 11/05/16 0654   11/05/16 0651  ceFAZolin (ANCEF) IVPB 2g/100 mL premix     2 g 200 mL/hr over 30 Minutes Intravenous On call to O.R. 11/05/16 0651 11/05/16 0849      Assessment/Plan: s/p Procedure(s): LEFT MASTECTOMY WITH SENTINEL LYMPH NODE BIOPSY (Left) IMMEDIATE BREAST RECONSTRUCTION WITH PLACEMENT OF TISSUE EXPANDER AND LATISSIMUS FLAP (Left) Monitor overnight   Encouraged to  increase po fluids, will kvo IV Will follow up in am.   LOS: 1 day    Clovia Reine,PA-C Plastic Surgery [phone]

## 2016-11-06 NOTE — Progress Notes (Signed)
1 Day Post-Op   Subjective/Chief Complaint: Complains of pain along back and chest wall   Objective: Vital signs in last 24 hours: Temp:  [96.4 F (35.8 C)-98.3 F (36.8 C)] 98.3 F (36.8 C) (06/05 0413) Pulse Rate:  [50-76] 67 (06/05 0413) Resp:  [7-22] 19 (06/05 0413) BP: (80-100)/(50-63) 81/50 (06/05 0413) SpO2:  [96 %-100 %] 98 % (06/05 0413) Last BM Date: 11/05/16  Intake/Output from previous day: 06/04 0701 - 06/05 0700 In: 3830 [P.O.:360; I.V.:3200; IV Piggyback:200] Out: 880 [Urine:655; Drains:195; Blood:30] Intake/Output this shift: No intake/output data recorded.  General appearance: alert and cooperative Resp: clear to auscultation bilaterally Chest wall: skin flaps look viable Cardio: regular rate and rhythm GI: soft, non-tender; bowel sounds normal; no masses,  no organomegaly  Lab Results:   Recent Labs  11/06/16 0519  WBC 10.0  HGB 10.0*  HCT 31.5*  PLT 230   BMET  Recent Labs  11/06/16 0519  NA 137  K 4.0  CL 105  CO2 24  GLUCOSE 118*  BUN 8  CREATININE 1.18*  CALCIUM 8.6*   PT/INR No results for input(s): LABPROT, INR in the last 72 hours. ABG No results for input(s): PHART, HCO3 in the last 72 hours.  Invalid input(s): PCO2, PO2  Studies/Results: Dg Chest Portable 1 View  Result Date: 11/05/2016 CLINICAL DATA:  Missing needle count. EXAM: PORTABLE CHEST 1 VIEW COMPARISON:  Radiographs of April 26, 2004. FINDINGS: The heart size and mediastinal contours are within normal limits. Both lungs are clear. The visualized skeletal structures are unremarkable. Endotracheal tube is in grossly good position. Probable surgical clips seen in the left axillary region as well as overlying the left lateral chest wall. No definite curvilinear density is seen to suggest a curved needle. Soft tissue gas is noted in the left breast tissues as well as surgical drain. IMPRESSION: Postsurgical changes are seen involving the left breast. Linear metallic  densities are seen the left axillary region and the soft tissues overlying the left lateral chest wall most consistent surgical clips. No curvilinear metallic density is seen to suggest retained needle. These results were called by telephone at the time of interpretation on 11/05/2016 at 12:37 pm to Kendell Bane, the patient's nurse, who verbally acknowledged these results. Electronically Signed   By: Marijo Conception, M.D.   On: 11/05/2016 12:38    Anti-infectives: Anti-infectives    Start     Dose/Rate Route Frequency Ordered Stop   11/05/16 1630  ceFAZolin (ANCEF) IVPB 2g/100 mL premix     2 g 200 mL/hr over 30 Minutes Intravenous Every 8 hours 11/05/16 1530     11/05/16 0906  polymyxin B 500,000 Units, bacitracin 50,000 Units in sodium chloride irrigation 0.9 % 500 mL irrigation  Status:  Discontinued       As needed 11/05/16 0906 11/05/16 1231   11/05/16 0651  ceFAZolin (ANCEF) IVPB 2g/100 mL premix  Status:  Discontinued     2 g 200 mL/hr over 30 Minutes Intravenous On call to O.R. 11/05/16 1610 11/05/16 0654   11/05/16 0651  ceFAZolin (ANCEF) IVPB 2g/100 mL premix     2 g 200 mL/hr over 30 Minutes Intravenous On call to O.R. 11/05/16 0651 11/05/16 0849      Assessment/Plan: s/p Procedure(s): LEFT MASTECTOMY WITH SENTINEL LYMPH NODE BIOPSY (Left) IMMEDIATE BREAST RECONSTRUCTION WITH PLACEMENT OF TISSUE EXPANDER AND LATISSIMUS FLAP (Left) Advance diet  Disposition per Plastic Surgery  LOS: 1 day    TOTH Goodman,Brandi Catalano S 11/06/2016

## 2016-11-07 DIAGNOSIS — D0512 Intraductal carcinoma in situ of left breast: Secondary | ICD-10-CM | POA: Diagnosis present

## 2016-11-07 DIAGNOSIS — F419 Anxiety disorder, unspecified: Secondary | ICD-10-CM | POA: Diagnosis present

## 2016-11-07 DIAGNOSIS — Z170 Estrogen receptor positive status [ER+]: Secondary | ICD-10-CM | POA: Diagnosis not present

## 2016-11-07 DIAGNOSIS — Z803 Family history of malignant neoplasm of breast: Secondary | ICD-10-CM | POA: Diagnosis not present

## 2016-11-07 DIAGNOSIS — Z923 Personal history of irradiation: Secondary | ICD-10-CM | POA: Diagnosis not present

## 2016-11-07 MED ORDER — ONDANSETRON 4 MG PO TBDP: 4.0000 mg | ORAL_TABLET | Freq: Four times a day (QID) | ORAL | 0 refills | Status: DC | PRN

## 2016-11-07 MED ORDER — HYDROCODONE-ACETAMINOPHEN 5-325 MG PO TABS: 1.0000 | ORAL_TABLET | ORAL | 0 refills | Status: DC | PRN

## 2016-11-07 MED ORDER — DIAZEPAM 2 MG PO TABS: 2.0000 mg | ORAL_TABLET | Freq: Two times a day (BID) | ORAL | 0 refills | Status: DC | PRN

## 2016-11-07 MED ORDER — NAPROXEN 500 MG PO TABS: 250.0000 mg | ORAL_TABLET | Freq: Three times a day (TID) | ORAL | Status: DC

## 2016-11-07 NOTE — Progress Notes (Signed)
Pt was ambulating in the hall, tolerating regular diet. Left breast incision with skin glue dry and intact. 3 JP drains intact, demonstrated to pt how to empty drains. Discharge instructions given to pt and spouse, verbalized understanding. Discharged to home.

## 2016-11-07 NOTE — Discharge Summary (Signed)
Physician Discharge Summary  Patient ID: Brandi Goodman MRN: [id number] DOB/AGE: [birth year] 56 y.o.  Admit date: 11/05/2016 Discharge date: 11/07/2016  Admission Diagnoses:  Patient Active Problem List   Diagnosis Date Noted  . Breast cancer ([place]) 11/05/2016  . 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 Anemia 05/22/2011     Discharge Diagnoses:  Active Problems:   Breast cancer 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 She finished radiation and took tamoxifen for 5 years. On her most recent follow-up mammogram she was found to have a new area of calcification at the old lumpectomy site. This was biopsied and came back as ductal carcinoma in situ. She was admitted for left mastectomy with sentinel node biopsy followed by immediate left latissimus flap reconstruction with placement of tissue expander. She had an uncomplicated post operative course and is ready for discharge home on POD#2.    Significant Diagnostic Studies: Final path pending  Treatments: Left mastectomy with sentinel node biopsy Immediate left latissimus myocutaneous flap and tissue expander placement for left breast reconstruction  Discharge Exam: Blood pressure 94/64, pulse 96, temperature 98.2 F (36.8 C), temperature source Oral, resp. rate 19, height 5\' 7"  (1.702 m), weight 71.4 kg (157 lb 6 oz), SpO2 100 %. General appearance: alert, cooperative, appears stated age and no distress Resp: clear to auscultation bilaterally Cardio: regular rate and rhythm Left breast latissimus flap is viable and without signs of hematoma or seroma or infection. JP drainage is moderate and serosanguinous.   Disposition: Final discharge disposition not confirmed    Follow-up Information    Dillingham, Loel Lofty, DO  Follow up in 1 week(s).   Specialty:  Plastic Surgery Contact information: [address] [place] [address] [phone]        Autumn Messing III, MD Follow up in 2 week(s).   Specialty:  General Surgery Contact information: [place] [address] [phone]           Signed: Ulysees Barns Plastic Surgery [phone]

## 2016-11-07 NOTE — Discharge Instructions (Signed)
Wear breast binder except for sink bathing.   Empty and record volumes of drains and bring to the office appointment  No ice or heating pads to the breast area

## 2016-11-07 NOTE — Progress Notes (Signed)
2 Days Post-Op   Subjective/Chief Complaint: Feeling better today. Pain under control with oral medications. Up ambulating with husband.  Left latissimus flap incisions clean, dry and intact and no seroma or hematoma. Flap viable. Back incision clean, dry and intact. JP drainage is moderate and serosanguinous.    Objective: Vital signs in last 24 hours: Temp:  [98.1 F (36.7 C)-98.3 F (36.8 C)] 98.2 F (36.8 C) (06/05 2148) Pulse Rate:  [60-96] 96 (06/05 2148) Resp:  [18-19] 19 (06/05 2148) BP: (94-99)/(62-64) 94/64 (06/05 2148) SpO2:  [99 %-100 %] 100 % (06/05 2148) Last BM Date: 11/05/16  Intake/Output from previous day: 06/05 0701 - 06/06 0700 In: 1930 [P.O.:580; I.V.:1250; IV Piggyback:100] Out: 110 [Drains:110] Intake/Output this shift: Total I/O In: -  Out: 85 [Drains:85]  General appearance: alert, cooperative, appears stated age and no distress Left latissimus flap is viable and no hematoma or seroma.   Lab Results:   Recent Labs  11/06/16 0519  WBC 10.0  HGB 10.0*  HCT 31.5*  PLT 230   BMET  Recent Labs  11/06/16 0519  NA 137  K 4.0  CL 105  CO2 24  GLUCOSE 118*  BUN 8  CREATININE 1.18*  CALCIUM 8.6*   PT/INR No results for input(s): LABPROT, INR in the last 72 hours. ABG No results for input(s): PHART, HCO3 in the last 72 hours.  Invalid input(s): PCO2, PO2  Studies/Results: Dg Chest Portable 1 View  Result Date: 11/05/2016 CLINICAL DATA:  Missing needle count. EXAM: PORTABLE CHEST 1 VIEW COMPARISON:  Radiographs of April 26, 2004. FINDINGS: The heart size and mediastinal contours are within normal limits. Both lungs are clear. The visualized skeletal structures are unremarkable. Endotracheal tube is in grossly good position. Probable surgical clips seen in the left axillary region as well as overlying the left lateral chest wall. No definite curvilinear density is seen to suggest a curved needle. Soft tissue gas is noted in the left  breast tissues as well as surgical drain. IMPRESSION: Postsurgical changes are seen involving the left breast. Linear metallic densities are seen the left axillary region and the soft tissues overlying the left lateral chest wall most consistent surgical clips. No curvilinear metallic density is seen to suggest retained needle. These results were called by telephone at the time of interpretation on 11/05/2016 at 12:37 pm to Kendell Bane, the patient's nurse, who verbally acknowledged these results. Electronically Signed   By: Marijo Conception, M.D.   On: 11/05/2016 12:38    Anti-infectives: Anti-infectives    Start     Dose/Rate Route Frequency Ordered Stop   11/05/16 1630  ceFAZolin (ANCEF) IVPB 2g/100 mL premix  Status:  Discontinued     2 g 200 mL/hr over 30 Minutes Intravenous Every 8 hours 11/05/16 1530 11/07/16 0814   11/05/16 0906  polymyxin B 500,000 Units, bacitracin 50,000 Units in sodium chloride irrigation 0.9 % 500 mL irrigation  Status:  Discontinued       As needed 11/05/16 0906 11/05/16 1231   11/05/16 0651  ceFAZolin (ANCEF) IVPB 2g/100 mL premix  Status:  Discontinued     2 g 200 mL/hr over 30 Minutes Intravenous On call to O.R. 11/05/16 2025 11/05/16 0654   11/05/16 0651  ceFAZolin (ANCEF) IVPB 2g/100 mL premix     2 g 200 mL/hr over 30 Minutes Intravenous On call to O.R. 11/05/16 0651 11/05/16 0849      Assessment/Plan: s/p Procedure(s): LEFT MASTECTOMY WITH SENTINEL LYMPH NODE BIOPSY (Left) IMMEDIATE  BREAST RECONSTRUCTION WITH PLACEMENT OF TISSUE EXPANDER AND LATISSIMUS FLAP (Left) Discharge home today.  follow up in office next week.   LOS: 1 day    [facility] Plastic Surgery [phone]

## 2016-11-15 ENCOUNTER — Telehealth: Payer: Self-pay

## 2016-11-15 NOTE — Telephone Encounter (Signed)
Pt called asking why she needs appt in July, Dr Magrinat had told her Decemeber. S/w Bary Castilla, and this is for f/u after her mastectomy surgery. Pt was agreeable.

## 2016-12-21 ENCOUNTER — Emergency Department (HOSPITAL_COMMUNITY)
Admission: EM | Admit: 2016-12-21 | Discharge: 2016-12-21 | Disposition: A | Discharge status: Home / Self Care | Payer: 59 | Attending: Emergency Medicine | Admitting: Emergency Medicine

## 2016-12-21 ENCOUNTER — Emergency Department (HOSPITAL_COMMUNITY): Payer: 59

## 2016-12-21 ENCOUNTER — Encounter (HOSPITAL_COMMUNITY): Payer: Self-pay | Admitting: *Deleted

## 2016-12-21 DIAGNOSIS — Z79899 Other long term (current) drug therapy: Secondary | ICD-10-CM | POA: Insufficient documentation

## 2016-12-21 DIAGNOSIS — R0789 Other chest pain: Secondary | ICD-10-CM | POA: Diagnosis present

## 2016-12-21 DIAGNOSIS — Z9012 Acquired absence of left breast and nipple: Secondary | ICD-10-CM | POA: Diagnosis not present

## 2016-12-21 DIAGNOSIS — Z853 Personal history of malignant neoplasm of breast: Secondary | ICD-10-CM | POA: Diagnosis not present

## 2016-12-21 DIAGNOSIS — F419 Anxiety disorder, unspecified: Secondary | ICD-10-CM | POA: Insufficient documentation

## 2016-12-21 LAB — COMPREHENSIVE METABOLIC PANEL
ALT: 10 U/L — AB (ref 14–54)
AST: 16 U/L (ref 15–41)
Albumin: 4.2 g/dL (ref 3.5–5.0)
Alkaline Phosphatase: 47 U/L (ref 38–126)
Anion gap: 8 (ref 5–15)
BUN: 13 mg/dL (ref 6–20)
CHLORIDE: 104 mmol/L (ref 101–111)
CO2: 29 mmol/L (ref 22–32)
CREATININE: 1.24 mg/dL — AB (ref 0.44–1.00)
Calcium: 9.8 mg/dL (ref 8.9–10.3)
GFR calc non Af Amer: 48 mL/min — ABNORMAL LOW (ref >60)
GFR, EST AFRICAN AMERICAN: 55 mL/min — AB (ref >60)
Glucose, Bld: 111 mg/dL — ABNORMAL HIGH (ref 65–99)
POTASSIUM: 3.6 mmol/L (ref 3.5–5.1)
Sodium: 141 mmol/L (ref 135–145)
Total Bilirubin: 0.7 mg/dL (ref 0.3–1.2)
Total Protein: 7.4 g/dL (ref 6.5–8.1)

## 2016-12-21 LAB — CBC WITH DIFFERENTIAL/PLATELET
Basophils Absolute: 0 10*3/uL (ref 0.0–0.1)
Basophils Relative: 0 %
EOS ABS: 0.1 10*3/uL (ref 0.0–0.7)
Eosinophils Relative: 2 %
HEMATOCRIT: 38.1 % (ref 36.0–46.0)
HEMOGLOBIN: 12 g/dL (ref 12.0–15.0)
LYMPHS ABS: 1.7 10*3/uL (ref 0.7–4.0)
LYMPHS PCT: 29 %
MCH: 31.3 pg (ref 26.0–34.0)
MCHC: 31.5 g/dL (ref 30.0–36.0)
MCV: 99.2 fL (ref 78.0–100.0)
MONOS PCT: 3 %
Monocytes Absolute: 0.2 10*3/uL (ref 0.1–1.0)
NEUTROS ABS: 3.8 10*3/uL (ref 1.7–7.7)
NEUTROS PCT: 66 %
Platelets: 288 10*3/uL (ref 150–400)
RBC: 3.84 MIL/uL — AB (ref 3.87–5.11)
RDW: 14.1 % (ref 11.5–15.5)
WBC: 5.8 10*3/uL (ref 4.0–10.5)

## 2016-12-21 LAB — I-STAT TROPONIN, ED: Troponin i, poc: 0 ng/mL (ref 0.00–0.08)

## 2016-12-21 MED ORDER — IOPAMIDOL (ISOVUE-370) INJECTION 76%
INTRAVENOUS | Status: AC
  Administered 2016-12-21: 100 mL
  Filled 2016-12-21: qty 100

## 2016-12-21 NOTE — ED Provider Notes (Signed)
[place] DEPT Provider Note   CSN: [id number] Arrival date & time: 12/21/16  1651     History   Chief Complaint Chief Complaint  Patient presents with  . Post-op Problem    HPI Brandi Goodman is a 56 y.o. female.  The history is provided by the patient and medical records.    56 year old female with history of anxiety, history of breast cancer with left mastectomy and recent reconstruction with skin flap, presenting to the ED for left-sided chest wall pain. Patient reports she had left breast reconstruction on 11/05/2016 with Dr. Migdalia Dk and Dr. Marlou Starks.  States skin flap was used, tissue expander was placed.  States over the past few days she has been having increased left sided chest pain which she states felt like "muscle spasms".  She did take a dose of valium she was given by surgeon without much relief.  She did go see her surgeon today in clinic and had some fluid removed from the tissue expander which helped a little.  States she does have increased pain with movement and deep breathing. Given these findings her surgeon was concerned for pulmonary embolus so she was sent here for further evaluation.  She has no history of DVT or PE. She denies any shortness of breath. She is no known cardiac history.  Past Medical History:  Diagnosis Date  . Anxiety    just lately  . Cancer [facility])    dx in 2005 left breast cancer  . Family history of adverse reaction to anesthesia    father had nausea & vomiting    Patient Active Problem List   Diagnosis Date Noted  . Breast cancer ([place]) 11/05/2016  . 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 Anemia 05/22/2011    Past Surgical History:  Procedure Laterality Date  . BREAST BIOPSY Left    Stereo   . BREAST LUMPECTOMY Left   . BREAST RECONSTRUCTION WITH PLACEMENT OF TISSUE EXPANDER AND FLEX HD (ACELLULAR HYDRATED DERMIS) Left 11/05/2016   Procedure: IMMEDIATE BREAST RECONSTRUCTION WITH PLACEMENT OF TISSUE EXPANDER AND  LATISSIMUS FLAP;  Surgeon: [name], DO;  Location: [facility];  Service: Plastics;  Laterality: Left;  . EYE SURGERY     od cataract removal & lens implant  . LAPAROSCOPIC OOPHERECTOMY     left ovary around 2008  . MASTECTOMY W/ SENTINEL NODE BIOPSY Left 11/05/2016   Procedure: LEFT MASTECTOMY WITH SENTINEL LYMPH NODE BIOPSY;  Surgeon: [name], MD;  Location: [facility];  Service: General;  Laterality: Left;    OB History    No data available       Home Medications    Prior to Admission medications   Medication Sig Start Date End Date Taking? Authorizing Provider  acetaminophen (TYLENOL) 325 MG tablet Take 325 mg by mouth every 6 (six) hours as needed for mild pain or moderate pain.    [provider]  cetirizine (ZYRTEC) 10 MG tablet Take 10 mg by mouth daily as needed for allergies.     [provider]  diazepam (VALIUM) 2 MG tablet Take 1 tablet (2 mg total) by mouth every 12 (twelve) hours as needed for muscle spasms. 11/07/16   Rayburn, Neta Mends, PA-C  diphenhydrAMINE (BENADRYL) 25 MG tablet Take 25 mg by mouth every 4 (four) hours as needed for itching or allergies.    [provider]  HYDROcodone-acetaminophen (NORCO/VICODIN) 5-325 MG tablet Take 1-2 tablets by mouth every 4 (four) hours as  needed for moderate pain. 11/07/16   Rayburn, Neta Mends, PA-C  Multiple Vitamin (MULTIVITAMIN WITH MINERALS) TABS tablet Take 1 tablet by mouth daily.    [provider]  naproxen (NAPROSYN) 500 MG tablet Take 0.5 tablets (250 mg total) by mouth 3 (three) times daily with meals. 11/07/16   Rayburn, Neta Mends, PA-C  Omega-3 Fatty Acids (FISH OIL) 1000 MG CAPS Take 1 capsule by mouth daily.    [provider]  ondansetron (ZOFRAN-ODT) 4 MG disintegrating tablet Take 1 tablet (4 mg total) by mouth every 6 (six) hours as needed for nausea. 11/07/16   Rayburn, Neta Mends, PA-C  polyethylene glycol (MIRALAX / GLYCOLAX) packet  Take 17 g by mouth daily as needed for mild constipation.    [provider]    Family History Family History  Problem Relation Age of Onset  . Breast cancer Neg Hx     Social History Social History  Substance Use Topics  . Smoking status: Never Smoker  . Smokeless tobacco: Never Used  . Alcohol use No     Allergies   No known allergies   Review of Systems Review of Systems  Cardiovascular: Positive for chest pain.  All other systems reviewed and are negative.    Physical Exam Updated Vital Signs BP 111/73 (BP Location: Right Arm)   Pulse 63   Temp 98.1 F (36.7 C) (Oral)   Resp 16   Ht 5' 7.5" (1.715 m)   SpO2 99%   Physical Exam  Constitutional: She is oriented to person, place, and time. She appears well-developed and well-nourished.  HENT:  Head: Normocephalic and atraumatic.  Mouth/Throat: Oropharynx is clear and moist.  Eyes: Pupils are equal, round, and reactive to light. Conjunctivae and EOM are normal.  Neck: Normal range of motion.  Cardiovascular: Normal rate, regular rhythm and normal heart sounds.   Pulmonary/Chest: Effort normal and breath sounds normal.  Left breast with flap incision noted, tissue expander palpable beneath the skin, incision appears clean without any signs of infection or drainage, no significant tenderness to palpation lungs are clear without wheezes or rhonchi, no distress  Abdominal: Soft. Bowel sounds are normal.  Musculoskeletal: Normal range of motion.  Neurological: She is alert and oriented to person, place, and time.  Skin: Skin is warm and dry.  Psychiatric: She has a normal mood and affect.  Nursing note and vitals reviewed.    ED Treatments / Results  Labs (all labs ordered are listed, but only abnormal results are displayed) Labs Reviewed  COMPREHENSIVE METABOLIC PANEL - Abnormal; Notable for the following:       Result Value   Glucose, Bld 111 (*)    Creatinine, Ser 1.24 (*)    ALT 10 (*)     GFR calc non Af Amer 48 (*)    GFR calc Af Amer 55 (*)    All other components within normal limits  CBC WITH DIFFERENTIAL/PLATELET - Abnormal; Notable for the following:    RBC 3.84 (*)    All other components within normal limits  I-STAT TROPONIN, ED    EKG  EKG Interpretation None       Radiology Ct Angio Chest Pe W And/or Wo Contrast  Result Date: 12/21/2016 CLINICAL DATA:  Left-sided chest pain and dyspnea. Post breast reconstruction 6 weeks prior. EXAM: CT ANGIOGRAPHY CHEST WITH CONTRAST TECHNIQUE: Multidetector CT imaging of the chest was performed using the standard protocol during bolus administration of intravenous contrast. Multiplanar CT image reconstructions and  MIPs were obtained to evaluate the vascular anatomy. CONTRAST:  66 cc Isovue 370 IV COMPARISON:  Most recent chest radiographs 11/05/2016. Chest CT 04/25/2009 FINDINGS: Cardiovascular: There are no filling defects within the pulmonary arteries to suggest pulmonary embolus. The heart is normal in size. Cannot assess for dissection given phase of IV contrast for pulmonary artery evaluation. Minimal aortic atherosclerosis. Minimal pericardial fluid/thickening anteriorly. Mediastinum/Nodes: No mediastinal or hilar adenopathy. The esophagus is decompressed. Visualized thyroid gland is normal. Lungs/Pleura: Linear subpleural densities in the anterior left upper lobe are unchanged from prior chest CT and consistent with postradiation change. 4 mm left lower lobe pulmonary nodule image 99 series 8 is unchanged from prior exam. Pleural-based 6 mm nodule in the right middle lobe is unchanged from prior exam. Biapical pleuroparenchymal scarring is again seen. Mild dependent atelectasis in both lower lobes. No confluent airspace disease. No pleural fluid. Trachea and mainstem bronchi are patent. Upper Abdomen: No acute abnormality. Musculoskeletal: There are no acute or suspicious osseous abnormalities. Left breast tissue expander in  place without associated fluid collection. Surgical clips in the left axilla. Review of the MIP images confirms the above findings. IMPRESSION: 1. No pulmonary embolus. No acute abnormality or explanation for chest pain. 2. Small pulmonary nodules are unchanged from 2010 CT (8 years prior), and considered benign. Electronically Signed   By: Jeb Levering M.D.   On: 12/21/2016 22:06    Procedures Procedures (including critical care time)  Medications Ordered in ED Medications - No data to display   Initial Impression / Assessment and Plan / ED Course  I have reviewed the triage vital signs and the nursing notes.  Pertinent labs & imaging results that were available during my care of the patient were reviewed by me and considered in my medical decision making (see chart for details).  56 year old female here with left-sided chest pain, sent in by surgeon to eval for PE. Recent left mastectomy with reconstruction in June 2018. States pain with inspiration but denies shortness of breath. No cardiac history.  EKG without acute ischemic changes.  Trop negative.  Labs reassuring.  CTA obtained without acute PE, stable pulmonary nodules.  Suspect chest wall pain-- patient reports she just returned to work this week and is recently started driving. May be strain type injury. Will have her follow-up closely with her surgeon. She was given strict return precautions for any new or worsening symptoms. She had husband at the bedside both acknowledged understanding and agreed with plan of care.  Final Clinical Impressions(s) / ED Diagnoses   Final diagnoses:  Chest wall pain    New Prescriptions Discharge Medication List as of 12/21/2016 11:09 PM       Larene Pickett, PA-C 12/21/16 2332    Lajean Saver, MD 12/21/16 2348

## 2016-12-21 NOTE — ED Notes (Signed)
Patient transported to CT 

## 2016-12-21 NOTE — ED Triage Notes (Signed)
Pt reports having left breast reconstructive surgery on 6/4. Onset yesterday of left side breast/chest wall pain. Pain increases with movement and breathing. Has been to md and had fluid drained from her insert but still no relief. Denies sob. No acute distress noted at triage.

## 2016-12-21 NOTE — Discharge Instructions (Signed)
As we discussed, your CT today did not show a blood clot.  There were some nodules but as I said, they are stable so will just need monitoring. Follow-up with your primary care doctor.  (copies of labs and imaging studies on back for their review). Return here for any new/worsening symptoms.

## 2016-12-25 ENCOUNTER — Telehealth: Payer: Self-pay | Admitting: *Deleted

## 2016-12-25 NOTE — Telephone Encounter (Signed)
Received call from pt stating that she needs to cancel appt for 12/27/16 because she is unable to make that day.  She also has an appt scheduled in Dec with Dr Jana Hakim.  She doesn't think she needs to be seen sooner but if so, she can be rescheduled. Message routed to Dr Magrinat/Pod RN & appts cancelled.

## 2016-12-26 ENCOUNTER — Telehealth: Payer: Self-pay

## 2016-12-26 NOTE — Telephone Encounter (Signed)
lvm for pt on her home phone stating that per Dr Jana Hakim she does not need to reschedule her cancelled appt for 07/26 as she has an appt in December and it is ok to not see him until then

## 2016-12-27 ENCOUNTER — Other Ambulatory Visit: Payer: 59

## 2016-12-27 ENCOUNTER — Ambulatory Visit: Payer: 59 | Admitting: Oncology

## 2017-01-29 ENCOUNTER — Other Ambulatory Visit: Payer: Self-pay | Admitting: Internal Medicine

## 2017-01-29 ENCOUNTER — Other Ambulatory Visit (HOSPITAL_COMMUNITY)
Admission: RE | Admit: 2017-01-29 | Discharge: 2017-01-29 | Disposition: A | Discharge status: Home / Self Care | Payer: 59 | Source: Ambulatory Visit | Attending: Internal Medicine | Admitting: Internal Medicine

## 2017-01-29 DIAGNOSIS — Z124 Encounter for screening for malignant neoplasm of cervix: Secondary | ICD-10-CM | POA: Insufficient documentation

## 2017-02-06 LAB — CYTOLOGY - PAP
Chlamydia: NEGATIVE
Diagnosis: NEGATIVE
HPV (WINDOPATH): NOT DETECTED
NEISSERIA GONORRHEA: NEGATIVE

## 2017-02-12 ENCOUNTER — Ambulatory Visit: Payer: Self-pay | Admitting: Plastic Surgery

## 2017-02-12 DIAGNOSIS — N651 Disproportion of reconstructed breast: Secondary | ICD-10-CM

## 2017-02-12 DIAGNOSIS — Z9012 Acquired absence of left breast and nipple: Secondary | ICD-10-CM

## 2017-02-12 NOTE — H&P (Signed)
Brandi Goodman is an 56 y.o. female.   Chief Complaint: acquired absence of left breast HPI: The patient is a 56 y.o. yrs old wf here for pre operative history and physical prior to secondary left breast reconstruction with removal of left breast tissue expander and placement of a silicone implant and right breast reduction/mastopexy for symmetry.  She underwent an immediate left latissimus flap with placement of tissue expander on 11/05/16.  The left expander currently has 420/300 cc. She is now ready for exchange surgery.  History:   She underwent left breast lumpectomy 12 years ago followed by radiation. For ductal carcinoma in situ, ER/PR positive.  She finished tamoxifen after 5 years.  She underwent a recent mammogram which showed a calcification at the old lumpectomy site.  The path showed ductal carcinoma in situ.  Her mother and paternal aunt have had breast cancer.   She is 5 feet 7 inches tall, weight is 155 pounds, preop bra= 34 C.  She is interested in a left mastectomy.  She has noticed some muscle twitching in the left breast.  There is obvious skin changes from the radiation. She has two children  Past Medical History:  Diagnosis Date  . Anxiety    just lately  . Cancer [facility])    dx in 2005 left breast cancer  . Family history of adverse reaction to anesthesia    father had nausea & vomiting    Past Surgical History:  Procedure Laterality Date  . BREAST BIOPSY Left    Stereo   . BREAST LUMPECTOMY Left   . BREAST RECONSTRUCTION WITH PLACEMENT OF TISSUE EXPANDER AND FLEX HD (ACELLULAR HYDRATED DERMIS) Left 11/05/2016   Procedure: IMMEDIATE BREAST RECONSTRUCTION WITH PLACEMENT OF TISSUE EXPANDER AND LATISSIMUS FLAP;  Surgeon: [name], DO;  Location: [place];  Service: Plastics;  Laterality: Left;  . EYE SURGERY     od cataract removal & lens implant  . LAPAROSCOPIC OOPHERECTOMY     left ovary around 2008  . MASTECTOMY W/ SENTINEL NODE BIOPSY Left 11/05/2016   Procedure: LEFT MASTECTOMY WITH SENTINEL LYMPH NODE BIOPSY;  Surgeon: [name], MD;  Location: [facility] OR;  Service: General;  Laterality: Left;    Family History  Problem Relation Age of Onset  . Breast cancer Neg Hx    Social History:  reports that she has never smoked. She has never used smokeless tobacco. She reports that she does not drink alcohol or use drugs.  Allergies:  Allergies  Allergen Reactions  . No Known Allergies      (Not in a hospital admission)  No results found for this or any previous visit (from the past 48 hour(s)). No results found.  Review of Systems  Constitutional: Negative.   HENT: Negative.   Eyes: Negative.   Respiratory: Negative.   Cardiovascular: Negative.   Gastrointestinal: Negative.   Genitourinary: Negative.   Musculoskeletal: Negative.   Skin: Negative.   Neurological: Negative.   Psychiatric/Behavioral: Negative.     There were no vitals taken for this visit. Physical Exam  Constitutional: She is oriented to person, place, and time. She appears well-developed and well-nourished.  HENT:  Head: Normocephalic and atraumatic.  Eyes: Pupils are equal, round, and reactive to light. EOM are normal.  Cardiovascular: Normal rate.   Respiratory: Effort normal.  GI: Soft. She exhibits no distension. There is no tenderness.  Neurological: She is alert and oriented to person, place, and time.  Skin: Skin is warm.  Psychiatric:  Her behavior is normal. Judgment and thought content normal.     Assessment/Plan Plan secondary left breast reconstruction with removal of left breast tissue expander and placement of a silicone implant and right breast reduction/mastopexy for symmetry.  [name], DO 02/12/2017, 1:53 PM

## 2017-02-13 ENCOUNTER — Ambulatory Visit: Payer: Self-pay | Admitting: Allergy and Immunology

## 2017-02-14 ENCOUNTER — Encounter (HOSPITAL_BASED_OUTPATIENT_CLINIC_OR_DEPARTMENT_OTHER): Payer: Self-pay | Admitting: *Deleted

## 2017-02-21 ENCOUNTER — Encounter (HOSPITAL_BASED_OUTPATIENT_CLINIC_OR_DEPARTMENT_OTHER)
Admission: RE | Disposition: A | Discharge status: Home / Self Care | Payer: Self-pay | Source: Ambulatory Visit | Attending: Plastic Surgery

## 2017-02-21 ENCOUNTER — Ambulatory Visit (HOSPITAL_BASED_OUTPATIENT_CLINIC_OR_DEPARTMENT_OTHER): Payer: 59 | Admitting: Anesthesiology

## 2017-02-21 ENCOUNTER — Ambulatory Visit: Payer: Self-pay | Admitting: Plastic Surgery

## 2017-02-21 ENCOUNTER — Encounter (HOSPITAL_BASED_OUTPATIENT_CLINIC_OR_DEPARTMENT_OTHER): Payer: Self-pay | Admitting: *Deleted

## 2017-02-21 ENCOUNTER — Ambulatory Visit (HOSPITAL_BASED_OUTPATIENT_CLINIC_OR_DEPARTMENT_OTHER)
Admission: RE | Admit: 2017-02-21 | Discharge: 2017-02-21 | Disposition: A | Discharge status: Home / Self Care | Payer: 59 | Source: Ambulatory Visit | Attending: Plastic Surgery | Admitting: Plastic Surgery

## 2017-02-21 DIAGNOSIS — N651 Disproportion of reconstructed breast: Secondary | ICD-10-CM

## 2017-02-21 DIAGNOSIS — N6011 Diffuse cystic mastopathy of right breast: Secondary | ICD-10-CM | POA: Insufficient documentation

## 2017-02-21 DIAGNOSIS — Z853 Personal history of malignant neoplasm of breast: Secondary | ICD-10-CM | POA: Diagnosis not present

## 2017-02-21 DIAGNOSIS — F419 Anxiety disorder, unspecified: Secondary | ICD-10-CM | POA: Insufficient documentation

## 2017-02-21 DIAGNOSIS — Z9012 Acquired absence of left breast and nipple: Secondary | ICD-10-CM | POA: Diagnosis not present

## 2017-02-21 HISTORY — PX: LIPOSUCTION: SHX10

## 2017-02-21 HISTORY — DX: Personal history of malignant neoplasm of breast: Z85.3

## 2017-02-21 HISTORY — PX: MASTOPEXY: SHX5358

## 2017-02-21 HISTORY — DX: Hypothyroidism, unspecified: E03.9

## 2017-02-21 HISTORY — PX: REMOVAL OF BILATERAL TISSUE EXPANDERS WITH PLACEMENT OF BILATERAL BREAST IMPLANTS: SHX6431

## 2017-02-21 HISTORY — DX: Dental restoration status: Z98.811

## 2017-02-21 SURGERY — REMOVAL, TISSUE EXPANDER, BREAST, BILATERAL, WITH BILATERAL IMPLANT IMPLANT INSERTION
Anesthesia: General | Site: Breast | Laterality: Right

## 2017-02-21 MED ORDER — DEXAMETHASONE SODIUM PHOSPHATE 10 MG/ML IJ SOLN
INTRAMUSCULAR | Status: AC
  Filled 2017-02-21: qty 1

## 2017-02-21 MED ORDER — ONDANSETRON HCL 4 MG/2ML IJ SOLN
INTRAMUSCULAR | Status: DC | PRN
  Administered 2017-02-21: 4 mg via INTRAVENOUS

## 2017-02-21 MED ORDER — CEFAZOLIN SODIUM-DEXTROSE 2-4 GM/100ML-% IV SOLN
INTRAVENOUS | Status: AC
  Filled 2017-02-21: qty 100

## 2017-02-21 MED ORDER — EPINEPHRINE 30 MG/30ML IJ SOLN
INTRAMUSCULAR | Status: AC
  Filled 2017-02-21: qty 1

## 2017-02-21 MED ORDER — FENTANYL CITRATE (PF) 100 MCG/2ML IJ SOLN
INTRAMUSCULAR | Status: AC
  Filled 2017-02-21: qty 2

## 2017-02-21 MED ORDER — MEPERIDINE HCL 25 MG/ML IJ SOLN: 6.2500 mg | INTRAMUSCULAR | Status: DC | PRN

## 2017-02-21 MED ORDER — LIDOCAINE HCL (CARDIAC) 20 MG/ML IV SOLN
INTRAVENOUS | Status: DC | PRN
  Administered 2017-02-21: 100 mg via INTRAVENOUS

## 2017-02-21 MED ORDER — ONDANSETRON HCL 4 MG/2ML IJ SOLN
INTRAMUSCULAR | Status: AC
  Filled 2017-02-21: qty 2

## 2017-02-21 MED ORDER — CEFAZOLIN SODIUM-DEXTROSE 2-4 GM/100ML-% IV SOLN
2.0000 g | INTRAVENOUS | Status: AC
  Administered 2017-02-21: 2 g via INTRAVENOUS

## 2017-02-21 MED ORDER — LIDOCAINE HCL (PF) 1 % IJ SOLN
INTRAMUSCULAR | Status: AC
  Filled 2017-02-21: qty 50

## 2017-02-21 MED ORDER — LACTATED RINGERS IV SOLN
INTRAVENOUS | Status: DC
  Administered 2017-02-21: 10 mL/h via INTRAVENOUS
  Administered 2017-02-21 (×2): via INTRAVENOUS

## 2017-02-21 MED ORDER — HYDROMORPHONE HCL 1 MG/ML IJ SOLN: 0.2500 mg | INTRAMUSCULAR | Status: DC | PRN

## 2017-02-21 MED ORDER — LACTATED RINGERS IV SOLN
INTRAVENOUS | Status: AC | PRN
  Administered 2017-02-21: 200 mL

## 2017-02-21 MED ORDER — EPHEDRINE SULFATE 50 MG/ML IJ SOLN
INTRAMUSCULAR | Status: DC | PRN
  Administered 2017-02-21: 15 mg via INTRAVENOUS
  Administered 2017-02-21 (×2): 10 mg via INTRAVENOUS

## 2017-02-21 MED ORDER — EPHEDRINE 5 MG/ML INJ
INTRAVENOUS | Status: AC
  Filled 2017-02-21: qty 10

## 2017-02-21 MED ORDER — MIDAZOLAM HCL 2 MG/2ML IJ SOLN
INTRAMUSCULAR | Status: AC
  Filled 2017-02-21: qty 2

## 2017-02-21 MED ORDER — SUCCINYLCHOLINE CHLORIDE 200 MG/10ML IV SOSY
PREFILLED_SYRINGE | INTRAVENOUS | Status: AC
  Filled 2017-02-21: qty 10

## 2017-02-21 MED ORDER — SCOPOLAMINE 1 MG/3DAYS TD PT72: 1.0000 | MEDICATED_PATCH | Freq: Once | TRANSDERMAL | Status: DC | PRN

## 2017-02-21 MED ORDER — LIDOCAINE 2% (20 MG/ML) 5 ML SYRINGE
INTRAMUSCULAR | Status: AC
  Filled 2017-02-21: qty 5

## 2017-02-21 MED ORDER — SODIUM CHLORIDE 0.9 % IV SOLN
INTRAVENOUS | Status: DC | PRN
  Administered 2017-02-21: 500 mL

## 2017-02-21 MED ORDER — MIDAZOLAM HCL 2 MG/2ML IJ SOLN
1.0000 mg | INTRAMUSCULAR | Status: DC | PRN
  Administered 2017-02-21: 2 mg via INTRAVENOUS

## 2017-02-21 MED ORDER — LIDOCAINE HCL 1 % IJ SOLN
INTRAMUSCULAR | Status: DC | PRN
Start: 2017-02-21 — End: 2017-02-21
  Administered 2017-02-21: 50 mL

## 2017-02-21 MED ORDER — OXYCODONE HCL 5 MG PO TABS: 5.0000 mg | ORAL_TABLET | Freq: Once | ORAL | Status: DC | PRN

## 2017-02-21 MED ORDER — PROMETHAZINE HCL 25 MG/ML IJ SOLN: 6.2500 mg | INTRAMUSCULAR | Status: DC | PRN

## 2017-02-21 MED ORDER — HYDROMORPHONE HCL 1 MG/ML IJ SOLN
0.2500 mg | INTRAMUSCULAR | Status: DC | PRN
  Administered 2017-02-21: 0.5 mg via INTRAVENOUS

## 2017-02-21 MED ORDER — DEXAMETHASONE SODIUM PHOSPHATE 4 MG/ML IJ SOLN
INTRAMUSCULAR | Status: DC | PRN
  Administered 2017-02-21: 10 mg via INTRAVENOUS

## 2017-02-21 MED ORDER — FENTANYL CITRATE (PF) 100 MCG/2ML IJ SOLN: 50.0000 ug | INTRAMUSCULAR | Status: DC | PRN

## 2017-02-21 MED ORDER — HYDROMORPHONE HCL 1 MG/ML IJ SOLN
INTRAMUSCULAR | Status: AC
  Filled 2017-02-21: qty 0.5

## 2017-02-21 MED ORDER — FENTANYL CITRATE (PF) 100 MCG/2ML IJ SOLN
INTRAMUSCULAR | Status: DC | PRN
  Administered 2017-02-21: 50 ug via INTRAVENOUS
  Administered 2017-02-21: 100 ug via INTRAVENOUS
  Administered 2017-02-21: 50 ug via INTRAVENOUS

## 2017-02-21 MED ORDER — EPINEPHRINE PF 1 MG/ML IJ SOLN
INTRAMUSCULAR | Status: DC | PRN
Start: 2017-02-21 — End: 2017-02-21
  Administered 2017-02-21: 1 mg

## 2017-02-21 MED ORDER — PROPOFOL 10 MG/ML IV BOLUS
INTRAVENOUS | Status: AC
  Filled 2017-02-21: qty 20

## 2017-02-21 MED ORDER — BUPIVACAINE-EPINEPHRINE 0.25% -1:200000 IJ SOLN
INTRAMUSCULAR | Status: DC | PRN
Start: 2017-02-21 — End: 2017-02-21
  Administered 2017-02-21: 10 mL

## 2017-02-21 MED ORDER — PHENYLEPHRINE 40 MCG/ML (10ML) SYRINGE FOR IV PUSH (FOR BLOOD PRESSURE SUPPORT)
PREFILLED_SYRINGE | INTRAVENOUS | Status: AC
  Filled 2017-02-21: qty 10

## 2017-02-21 MED ORDER — OXYCODONE HCL 5 MG/5ML PO SOLN: 5.0000 mg | Freq: Once | ORAL | Status: DC | PRN

## 2017-02-21 MED ORDER — PROPOFOL 10 MG/ML IV BOLUS
INTRAVENOUS | Status: DC | PRN
  Administered 2017-02-21: 200 mg via INTRAVENOUS

## 2017-02-21 SURGICAL SUPPLY — 65 items
ADH SKN CLS APL DERMABOND .7 (GAUZE/BANDAGES/DRESSINGS) ×8
BAG DECANTER FOR FLEXI CONT (MISCELLANEOUS) ×5 IMPLANT
BINDER BREAST LRG (GAUZE/BANDAGES/DRESSINGS) ×2 IMPLANT
BIOPATCH RED 1 DISK 7.0 (GAUZE/BANDAGES/DRESSINGS) ×2 IMPLANT
BLADE HEX COATED 2.75 (ELECTRODE) ×5 IMPLANT
BLADE KNIFE PERSONA 10 (BLADE) ×10 IMPLANT
BLADE SURG 15 STRL LF DISP TIS (BLADE) ×7 IMPLANT
BLADE SURG 15 STRL SS (BLADE) ×5
BNDG GAUZE ELAST 4 BULKY (GAUZE/BANDAGES/DRESSINGS) ×10 IMPLANT
CANISTER SUCT 1200ML W/VALVE (MISCELLANEOUS) ×5 IMPLANT
CHLORAPREP W/TINT 26ML (MISCELLANEOUS) ×5 IMPLANT
COVER BACK TABLE 60X90IN (DRAPES) ×5 IMPLANT
COVER MAYO STAND STRL (DRAPES) ×5 IMPLANT
DERMABOND ADVANCED (GAUZE/BANDAGES/DRESSINGS) ×2
DERMABOND ADVANCED .7 DNX12 (GAUZE/BANDAGES/DRESSINGS) ×2 IMPLANT
DRAIN CHANNEL 19F RND (DRAIN) IMPLANT
DRAPE LAPAROSCOPIC ABDOMINAL (DRAPES) ×5 IMPLANT
DRSG PAD ABDOMINAL 8X10 ST (GAUZE/BANDAGES/DRESSINGS) ×10 IMPLANT
ELECT BLADE 4.0 EZ CLEAN MEGAD (MISCELLANEOUS) ×5
ELECT REM PT RETURN 9FT ADLT (ELECTROSURGICAL) ×5
ELECTRODE BLDE 4.0 EZ CLN MEGD (MISCELLANEOUS) ×4 IMPLANT
ELECTRODE REM PT RTRN 9FT ADLT (ELECTROSURGICAL) ×4 IMPLANT
EVACUATOR SILICONE 100CC (DRAIN) IMPLANT
GLOVE BIO SURGEON STRL SZ 6.5 (GLOVE) ×15 IMPLANT
GOWN STRL REUS W/ TWL LRG LVL3 (GOWN DISPOSABLE) ×12 IMPLANT
GOWN STRL REUS W/TWL LRG LVL3 (GOWN DISPOSABLE) ×15
IMPL BREAST SMOOTH UH 455CC (Breast) ×1 IMPLANT
IMPLANT BREAST SMOOTH UH 455CC (Breast) ×5 IMPLANT
KIT FILL SYSTEM UNIVERSAL (SET/KITS/TRAYS/PACK) IMPLANT
NDL HYPO 25X1 1.5 SAFETY (NEEDLE) ×3 IMPLANT
NDL SAFETY ECLIPSE 18X1.5 (NEEDLE) ×4 IMPLANT
NEEDLE HYPO 18GX1.5 SHARP (NEEDLE) ×5
NEEDLE HYPO 25X1 1.5 SAFETY (NEEDLE) ×5 IMPLANT
NS IRRIG 1000ML POUR BTL (IV SOLUTION) ×2 IMPLANT
PACK BASIN DAY SURGERY FS (CUSTOM PROCEDURE TRAY) ×5 IMPLANT
PAD ALCOHOL SWAB (MISCELLANEOUS) ×4 IMPLANT
PENCIL BUTTON HOLSTER BLD 10FT (ELECTRODE) ×5 IMPLANT
PIN SAFETY STERILE (MISCELLANEOUS) ×2 IMPLANT
SIZER BREAST REUSE 455CC (SIZER) ×5
SIZER BRST REUSE 455CC (SIZER) ×1 IMPLANT
SLEEVE SCD COMPRESS KNEE MED (MISCELLANEOUS) ×5 IMPLANT
SPONGE LAP 18X18 X RAY DECT (DISPOSABLE) ×8 IMPLANT
STRIP SUTURE WOUND CLOSURE 1/2 (SUTURE) ×10 IMPLANT
SUT MNCRL AB 4-0 PS2 18 (SUTURE) ×14 IMPLANT
SUT MON AB 3-0 SH 27 (SUTURE) ×15
SUT MON AB 3-0 SH27 (SUTURE) ×6 IMPLANT
SUT MON AB 5-0 PS2 18 (SUTURE) ×12 IMPLANT
SUT PDS 3-0 CT2 (SUTURE)
SUT PDS AB 2-0 CT2 27 (SUTURE) IMPLANT
SUT PDS II 3-0 CT2 27 ABS (SUTURE) IMPLANT
SUT SILK 3 0 PS 1 (SUTURE) ×2 IMPLANT
SUT VIC AB 3-0 SH 27 (SUTURE)
SUT VIC AB 3-0 SH 27X BRD (SUTURE) IMPLANT
SUT VICRYL 4-0 PS2 18IN ABS (SUTURE) IMPLANT
SYR 3ML 23GX1 SAFETY (SYRINGE) ×5 IMPLANT
SYR 50ML LL SCALE MARK (SYRINGE) ×2 IMPLANT
SYR BULB IRRIGATION 50ML (SYRINGE) ×5 IMPLANT
SYR CONTROL 10ML LL (SYRINGE) ×5 IMPLANT
TAPE MEASURE VINYL STERILE (MISCELLANEOUS) ×3 IMPLANT
TOWEL OR 17X24 6PK STRL BLUE (TOWEL DISPOSABLE) ×10 IMPLANT
TUBE CONNECTING 20X1/4 (TUBING) ×5 IMPLANT
TUBING INFILTRATION IT-10001 (TUBING) ×2 IMPLANT
TUBING SET GRADUATE ASPIR 12FT (MISCELLANEOUS) ×2 IMPLANT
UNDERPAD 30X30 (UNDERPADS AND DIAPERS) ×10 IMPLANT
YANKAUER SUCT BULB TIP NO VENT (SUCTIONS) ×5 IMPLANT

## 2017-02-21 NOTE — Anesthesia Procedure Notes (Signed)
Procedure Name: LMA Insertion Date/Time: 02/21/2017 9:31 AM Performed by: Melynda Ripple D Pre-anesthesia Checklist: Patient identified, Emergency Drugs available, Suction available and Patient being monitored Patient Re-evaluated:Patient Re-evaluated prior to induction Oxygen Delivery Method: Circle system utilized Preoxygenation: Pre-oxygenation with 100% oxygen Induction Type: IV induction Ventilation: Mask ventilation without difficulty LMA: LMA inserted LMA Size: 3.0 Number of attempts: 1 Airway Equipment and Method: Bite block Placement Confirmation: positive ETCO2 Tube secured with: Tape Dental Injury: Teeth and Oropharynx as per pre-operative assessment

## 2017-02-21 NOTE — Discharge Instructions (Signed)
May shower tomorrow. No heavy lifting. Continue binder or sports bra.    Post Anesthesia Home Care Instructions  Activity: Get plenty of rest for the remainder of the day. A responsible individual must stay with you for 24 hours following the procedure.  For the next 24 hours, DO NOT: -Drive a car -Paediatric nurse -Drink alcoholic beverages -Take any medication unless instructed by your physician -Make any legal decisions or sign important papers.  Meals: Start with liquid foods such as gelatin or soup. Progress to regular foods as tolerated. Avoid greasy, spicy, heavy foods. If nausea and/or vomiting occur, drink only clear liquids until the nausea and/or vomiting subsides. Call your physician if vomiting continues.  Special Instructions/Symptoms: Your throat may feel dry or sore from the anesthesia or the breathing tube placed in your throat during surgery. If this causes discomfort, gargle with warm salt water. The discomfort should disappear within 24 hours.  If you had a scopolamine patch placed behind your ear for the management of post- operative nausea and/or vomiting:  1. The medication in the patch is effective for 72 hours, after which it should be removed.  Wrap patch in a tissue and discard in the trash. Wash hands thoroughly with soap and water. 2. You may remove the patch earlier than 72 hours if you experience unpleasant side effects which may include dry mouth, dizziness or visual disturbances. 3. Avoid touching the patch. Wash your hands with soap and water after contact with the patch.

## 2017-02-21 NOTE — Op Note (Signed)
Op report   DATE OF OPERATION:  02/21/2017  LOCATION: [place]  SURGICAL DIVISION: Plastic Surgery  PREOPERATIVE DIAGNOSES:  1. History of left breast cancer.  2. Acquired absence of left breast.  3. Breast asymmetry after reconstruction.  POSTOPERATIVE DIAGNOSES:  1. History of left breast cancer.  2. Acquired absence of left breast.  3. Breast asymmetry after reconstruction.  PROCEDURE:  1. Exchange of tissue expander for implant. 2. Capsulotomies for implant respositioning. 3. Right breast mastopexy.  SURGEON: Alejandria Wessells Sanger Nolawi Kanady, DO  ASSISTANT: Shawn Rayburn, PA  ANESTHESIA:  General.   COMPLICATIONS: None.   IMPLANTS: [employer] Smooth Round Ultra High Profile Gel 455cc. Ref #[id number].  Serial Number [id number]  INDICATIONS FOR PROCEDURE:  The patient, Brandi Goodman, is a 56 y.o. female born on 08/24/1960, is here for treatment for further treatment after a mastectomy and placement of a tissue expander. She now presents for exchange of her expander for an implant.  She requires capsulotomies to better position the implant. MRN: [id number]  CONSENT:  Informed consent was obtained directly from the patient. Risks, benefits and alternatives were fully discussed. Specific risks including but not limited to bleeding, infection, hematoma, seroma, scarring, pain, implant infection, implant extrusion, capsular contracture, asymmetry, wound healing problems, and need for further surgery were all discussed. The patient did have an ample opportunity to have her questions answered to her satisfaction.   DESCRIPTION OF LEFT PROCEDURE:  The patient was taken to the operating room. SCDs were placed and IV antibiotics were given. The patient's chest was prepped and draped in a sterile fashion. A time out was performed and the implants to be used were identified.  One percent Xylocaine with epinephrine was used to infiltrate the area.  Tumescent was  placed in the lateral breast axillary area. Liposuction was done carefully to reduce the bulk in the lateral area without injury to the latissimus muscle.  The old mastectomy scar was opened and superior mastectomy at the lower incision of the latissimus muscle flap.  The inferior mastectomy flaps was raised over the muscle. The latissimus muscle was split to expose the tissue expander which was removed. Inspection of the pocket showed a normal healthy capsule.  Circumferential capsulotomies were performed to allow for breast pocket expansion.  Measurements were made to confirm adequate pocket size for the implant dimensions.  Hemostasis was ensured with electrocautery.  The pocket was irrigated with antibiotic solution.  New gloves were placed.  The implant was placed in the pocket and oriented appropriately. The pectoralis major muscle and capsule on the anterior surface were re-closed with a 3-0 running Monocryl suture. The remaining skin was closed with 4-0 Monocryl deep dermal and 5-0 Monocryl subcuticular stitches.  Dermabond was applied.  A breast binder and ABD was applied.  The patient was allowed to wake from anesthesia and taken to the recovery room in satisfactory condition.   DESCRIPTION OF RIGHT PROCEDURE  Right side: Preoperative markings were confirmed.  Incision lines were injected with 1% Xylocaine with epinephrine.  After waiting for vasoconstriction, the marked lines were incised.  A Wise-pattern breast mastopexy was performed by de-epithelializing the pedicle, using bovie to create the superomedial pedicle, and removing breast tissue from the inferior portion of the breast.  Care was taken to not undermine the breast pedicle. Hemostasis was achieved.  The nipple was gently rotated into position and the soft tissue closed with 4-0 Monocryl.   The deep tissues were approximated with 3-0 monocryl sutures and  the skin was closed with deep dermal and subcuticular 4-0 Monocryl sutures.  The  nipple and skin flaps had good capillary refill at the end of the procedure.  Dermabond was applied.  A breast binder and ABDs were placed.  Light liposuction was performed laterally to help with contour and size match. The nipple and skin flaps had good capillary refill at the end of the procedure.  The patient tolerated the procedure well. The patient was allowed to wake from anesthesia and taken to the recovery room in satisfactory condition

## 2017-02-21 NOTE — Anesthesia Postprocedure Evaluation (Signed)
Anesthesia Post Note  Patient: Brandi Goodman  Procedure(s) Performed: Procedure(s) (LRB): REMOVAL OF LEFT  TISSUE EXPANDERS WITH PLACEMENT OF BREAST IMPLANT (Left) LIPOSUCTION (Bilateral) MASTOPEXY (Right)     Patient location during evaluation: PACU Anesthesia Type: General Level of consciousness: sedated and patient cooperative Pain management: pain level controlled Vital Signs Assessment: post-procedure vital signs reviewed and stable Respiratory status: spontaneous breathing Cardiovascular status: stable Anesthetic complications: no    Last Vitals:  Vitals:   02/21/17 1230 02/21/17 1257  BP: (!) 115/59 113/63  Pulse: 78 76  Resp: 16 20  Temp:  36.4 C  SpO2: 97% 100%    Last Pain:  Vitals:   02/21/17 1257  TempSrc: Oral  PainSc: 2                  Nolon Nations

## 2017-02-21 NOTE — Transfer of Care (Signed)
Immediate Anesthesia Transfer of Care Note  Patient: Brandi Goodman  Procedure(s) Performed: Procedure(s): REMOVAL OF LEFT  TISSUE EXPANDERS WITH PLACEMENT OF BREAST IMPLANT (Left) LIPOSUCTION (Bilateral) MASTOPEXY (Right)  Patient Location: PACU  Anesthesia Type:General  Level of Consciousness: awake, oriented and drowsy  Airway & Oxygen Therapy: Patient Spontanous Breathing and Patient connected to face mask oxygen  Post-op Assessment: Report given to RN and Post -op Vital signs reviewed and stable  Post vital signs: Reviewed and stable  Last Vitals:  Vitals:   02/21/17 0856  BP: 115/65  Pulse: 71  Resp: 20  Temp: 36.6 C  SpO2: 100%    Last Pain:  Vitals:   02/21/17 0856  TempSrc: Oral         Complications: No apparent anesthesia complications

## 2017-02-21 NOTE — H&P (View-Only) (Signed)
Brandi Goodman is an 56 y.o. female.   Chief Complaint: acquired absence of left breast HPI: The patient is a 56 y.o. yrs old wf here for pre operative history and physical prior to secondary left breast reconstruction with removal of left breast tissue expander and placement of a silicone implant and right breast reduction/mastopexy for symmetry.  She underwent an immediate left latissimus flap with placement of tissue expander on 11/05/16.  The left expander currently has 420/300 cc. She is now ready for exchange surgery.  History:   She underwent left breast lumpectomy 12 years ago followed by radiation. For ductal carcinoma in situ, ER/PR positive.  She finished tamoxifen after 5 years.  She underwent a recent mammogram which showed a calcification at the old lumpectomy site.  The path showed ductal carcinoma in situ.  Her mother and paternal aunt have had breast cancer.   She is 5 feet 7 inches tall, weight is 155 pounds, preop bra= 34 C.  She is interested in a left mastectomy.  She has noticed some muscle twitching in the left breast.  There is obvious skin changes from the radiation. She has two children  Past Medical History:  Diagnosis Date  . Anxiety    just lately  . Cancer [facility])    dx in 2005 left breast cancer  . Family history of adverse reaction to anesthesia    father had nausea & vomiting    Past Surgical History:  Procedure Laterality Date  . BREAST BIOPSY Left    Stereo   . BREAST LUMPECTOMY Left   . BREAST RECONSTRUCTION WITH PLACEMENT OF TISSUE EXPANDER AND FLEX HD (ACELLULAR HYDRATED DERMIS) Left 11/05/2016   Procedure: IMMEDIATE BREAST RECONSTRUCTION WITH PLACEMENT OF TISSUE EXPANDER AND LATISSIMUS FLAP;  Surgeon: [name], DO;  Location: [place];  Service: Plastics;  Laterality: Left;  . EYE SURGERY     od cataract removal & lens implant  . LAPAROSCOPIC OOPHERECTOMY     left ovary around 2008  . MASTECTOMY W/ SENTINEL NODE BIOPSY Left 11/05/2016   Procedure: LEFT MASTECTOMY WITH SENTINEL LYMPH NODE BIOPSY;  Surgeon: [name], MD;  Location: [facility] OR;  Service: General;  Laterality: Left;    Family History  Problem Relation Age of Onset  . Breast cancer Neg Hx    Social History:  reports that she has never smoked. She has never used smokeless tobacco. She reports that she does not drink alcohol or use drugs.  Allergies:  Allergies  Allergen Reactions  . No Known Allergies      (Not in a hospital admission)  No results found for this or any previous visit (from the past 48 hour(s)). No results found.  Review of Systems  Constitutional: Negative.   HENT: Negative.   Eyes: Negative.   Respiratory: Negative.   Cardiovascular: Negative.   Gastrointestinal: Negative.   Genitourinary: Negative.   Musculoskeletal: Negative.   Skin: Negative.   Neurological: Negative.   Psychiatric/Behavioral: Negative.     There were no vitals taken for this visit. Physical Exam  Constitutional: She is oriented to person, place, and time. She appears well-developed and well-nourished.  HENT:  Head: Normocephalic and atraumatic.  Eyes: Pupils are equal, round, and reactive to light. EOM are normal.  Cardiovascular: Normal rate.   Respiratory: Effort normal.  GI: Soft. She exhibits no distension. There is no tenderness.  Neurological: She is alert and oriented to person, place, and time.  Skin: Skin is warm.  Psychiatric:  Her behavior is normal. Judgment and thought content normal.     Assessment/Plan Plan secondary left breast reconstruction with removal of left breast tissue expander and placement of a silicone implant and right breast reduction/mastopexy for symmetry.  [name], DO 02/12/2017, 1:53 PM

## 2017-02-21 NOTE — Anesthesia Preprocedure Evaluation (Signed)
Anesthesia Evaluation  Patient identified by MRN, date of birth, ID band Patient awake    Reviewed: Allergy & Precautions, NPO status , Patient's Chart, lab work & pertinent test results  Airway Mallampati: II  TM Distance: >3 FB Neck ROM: Full    Dental no notable dental hx. (+) Dental Advisory Given, Teeth Intact   Pulmonary neg pulmonary ROS,    Pulmonary exam normal breath sounds clear to auscultation       Cardiovascular negative cardio ROS Normal cardiovascular exam Rhythm:Regular Rate:Normal     Neuro/Psych negative neurological ROS  negative psych ROS   GI/Hepatic negative GI ROS, Neg liver ROS,   Endo/Other  negative endocrine ROSHypothyroidism   Renal/GU negative Renal ROS  negative genitourinary   Musculoskeletal negative musculoskeletal ROS (+)   Abdominal   Peds negative pediatric ROS (+)  Hematology  (+) anemia ,   Anesthesia Other Findings   Reproductive/Obstetrics negative OB ROS                             Anesthesia Physical  Anesthesia Plan  ASA: II  Anesthesia Plan: General   Post-op Pain Management:    Induction: Intravenous  PONV Risk Score and Plan: 4 or greater and Ondansetron, Dexamethasone, Midazolam and Scopolamine patch - Pre-op  Airway Management Planned: Oral ETT  Additional Equipment:   Intra-op Plan:   Post-operative Plan: Extubation in OR  Informed Consent: I have reviewed the patients History and Physical, chart, labs and discussed the procedure including the risks, benefits and alternatives for the proposed anesthesia with the patient or authorized representative who has indicated his/her understanding and acceptance.   Dental advisory given  Plan Discussed with: CRNA and Surgeon  Anesthesia Plan Comments:         Anesthesia Quick Evaluation

## 2017-02-21 NOTE — Interval H&P Note (Signed)
History and Physical Interval Note:  02/21/2017 9:06 AM  Brandi Goodman  has presented today for surgery, with the diagnosis of ACQUIRED ABSENCE OF LEFT BREAST AND NIPPLE, MALIGNANT NEOPLASM OF LEFT FEMALE BREAST  The various methods of treatment have been discussed with the patient and family. After consideration of risks, benefits and other options for treatment, the patient has consented to  Procedure(s): REMOVAL OF LEFT  TISSUE EXPANDERS WITH PLACEMENT OF BREAST IMPLANTS (Left) POSSIBLE RIGHT MAMMARY REDUCTION  (BREAST) FOR SYMMETRY (Right) as a surgical intervention .  The patient's history has been reviewed, patient examined, no change in status, stable for surgery.  I have reviewed the patient's chart and labs.  Questions were answered to the patient's satisfaction.     [name]

## 2017-02-22 ENCOUNTER — Encounter (HOSPITAL_BASED_OUTPATIENT_CLINIC_OR_DEPARTMENT_OTHER): Payer: Self-pay | Admitting: Plastic Surgery

## 2017-02-25 ENCOUNTER — Encounter (HOSPITAL_BASED_OUTPATIENT_CLINIC_OR_DEPARTMENT_OTHER): Payer: Self-pay | Admitting: Plastic Surgery

## 2017-03-05 ENCOUNTER — Telehealth: Payer: Self-pay

## 2017-03-05 NOTE — Telephone Encounter (Signed)
[place] nurse case manager with [facility] called requesting last OV note be faxed. S/w pt and obtained permission then faxed OV note of 10/09/16. Fax # [phone] Phone [phone] ext [phone]

## 2017-04-10 ENCOUNTER — Ambulatory Visit: Payer: 59 | Admitting: Allergy and Immunology

## 2017-04-10 ENCOUNTER — Encounter: Payer: Self-pay | Admitting: Allergy and Immunology

## 2017-04-10 VITALS — BP 110/60 | HR 72 | Resp 16 | Ht 66.5 in | Wt 138.4 lb

## 2017-04-10 DIAGNOSIS — Z91018 Allergy to other foods: Secondary | ICD-10-CM | POA: Diagnosis not present

## 2017-04-10 DIAGNOSIS — L308 Other specified dermatitis: Secondary | ICD-10-CM

## 2017-04-10 DIAGNOSIS — L500 Allergic urticaria: Secondary | ICD-10-CM | POA: Diagnosis not present

## 2017-04-10 DIAGNOSIS — J3089 Other allergic rhinitis: Secondary | ICD-10-CM

## 2017-04-10 DIAGNOSIS — L989 Disorder of the skin and subcutaneous tissue, unspecified: Secondary | ICD-10-CM

## 2017-04-10 NOTE — Progress Notes (Signed)
Dear Avon Gully,  Thank you for referring Brandi Goodman to the [facility] on 04/10/2017.   Below is a summation of this patient's evaluation and recommendations.  Thank you for your referral. I will keep you informed about this patient's response to treatment.   If you have any questions please do not hesitate to contact me.   Sincerely,  Jiles Prows, MD Allergy / Immunology [place]   ______________________________________________________________________    NEW PATIENT NOTE  Referring Provider: Avon Gully, NP Primary Provider: Leeroy Cha, MD Date of office visit: 04/10/2017    Subjective:   Chief Complaint:  Brandi Goodman (DOB: Jul 10, 1960) is a 56 y.o. female who presents to the clinic on 04/10/2017 with a chief complaint of Allergies and Food Allergy .     HPI: Brandi Goodman presents to this clinic in evaluation of a possible food reaction  In May 2018 she developed a itchy dermatitis on her arms and legs that awoken her at night and required the administration of Benadryl with resolution within 24 hours without healing with scar or hyperpigmentation and no associated systemic or constitutional symptoms.  This reaction occurred several hours after eating a beef meal and approximately 8 hours after having exposure to the outdoors while walking in grass.  She did not take any new medications that day or use any new supplements or have any issues suggesting that she may have had an infectious disease.  She has not eaten beef or pork since that event.  She does have a history of allergic rhinitis occurring during the spring and fall for which she will take Zyrtec and she feels that this is a minimal problem at this point in time.  She does occasionally get itchy palms for which she needs to take a Benadryl 1 or 2 times per month without any associated systemic  or constitutional symptoms and without any obvious dermatitis and without any obvious trigger.  She also has a history of eczema in her ears for which she is seen at dermatologist who has given her steroid topical agent which she uses intermittently which works very well.  Past Medical History:  Diagnosis Date  . Anxiety    just lately  . Dental crown present   . Family history of adverse reaction to anesthesia    pt's father has hx. of post-op N/V  . History of breast cancer   . Hypothyroidism     Past Surgical History:  Procedure Laterality Date  . BREAST EXCISIONAL BIOPSY Left 07/06/2004  . CATARACT EXTRACTION W/ INTRAOCULAR LENS IMPLANT Right   . HYSTEROSCOPY W/D&C  02/13/2007; 04/09/2008  . LAPAROSCOPIC OOPHERECTOMY     left ovary around 2008  . LAPAROSCOPIC SALPINGOOPHERECTOMY Left 05/07/2008  . MASTECTOMY, PARTIAL Left 05/01/2004  . RE-EXCISION OF BREAST LUMPECTOMY Left 05/17/2004    Allergies as of 04/10/2017      Reactions   Beef-derived Products Rash      Medication List      aspirin EC 81 MG tablet Take by mouth.   cetirizine 10 MG tablet Commonly known as:  ZYRTEC Take 10 mg by mouth.   DOCOSAHEXAENOIC ACID PO Take 1 g by mouth.   levothyroxine 125 MCG tablet Commonly known as:  SYNTHROID, LEVOTHROID Take 125 mcg by mouth daily before breakfast.   multivitamin tablet Take 1 tablet by mouth daily.       Review of  systems negative except as noted in HPI / PMHx or noted below:  Review of Systems  Constitutional: Negative.   HENT: Negative.   Eyes: Negative.   Respiratory: Negative.   Cardiovascular: Negative.   Gastrointestinal: Negative.   Genitourinary: Negative.   Musculoskeletal: Negative.   Skin: Negative.   Neurological: Negative.   Endo/Heme/Allergies: Negative.   Psychiatric/Behavioral: Negative.     Family History  Problem Relation Age of Onset  . Anesthesia problems Father        post-op N/V    Social History    Socioeconomic History  . Marital status: Married    Spouse name: Not on file  . Number of children: Not on file  . Years of education: Not on file  . Highest education level: Not on file  Social Needs  . Financial resource strain: Not on file  . Food insecurity - worry: Not on file  . Food insecurity - inability: Not on file  . Transportation needs - medical: Not on file  . Transportation needs - non-medical: Not on file  Occupational History  . Not on file  Tobacco Use  . Smoking status: Never Smoker  . Smokeless tobacco: Never Used  Substance and Sexual Activity  . Alcohol use: No  . Drug use: No  . Sexual activity: Not on file  Other Topics Concern  . Not on file  Social History Narrative  . Not on file    Environmental and Social history  Lives in a house with a dry environment, cats and dogs located inside the household, carpet in the bedroom, no plastic on the bed, no plastic on the pillow, and no smokers located inside the household.  She works in an office setting.  Objective:   Vitals:   04/10/17 1147  BP: 110/60  Pulse: 72  Resp: 16   Height: 5' 6.5" (168.9 cm) Weight: 138 lb 6.4 oz (62.8 kg)  Physical Exam  Constitutional: She is well-developed, well-nourished, and in no distress.  HENT:  Head: Normocephalic. Head is without right periorbital erythema and without left periorbital erythema.  Right Ear: Tympanic membrane, external ear and ear canal normal.  Left Ear: Tympanic membrane, external ear and ear canal normal.  Nose: Nose normal. No mucosal edema or rhinorrhea.  Mouth/Throat: Uvula is midline, oropharynx is clear and moist and mucous membranes are normal. No oropharyngeal exudate.  Eyes: Conjunctivae and lids are normal. Pupils are equal, round, and reactive to light.  Neck: Trachea normal. No tracheal tenderness present. No tracheal deviation present. No thyromegaly present.  Cardiovascular: Normal rate, regular rhythm, S1 normal, S2 normal  and normal heart sounds.  No murmur heard. Pulmonary/Chest: Effort normal and breath sounds normal. No stridor. No tachypnea. No respiratory distress. She has no wheezes. She has no rales. She exhibits no tenderness.  Abdominal: Soft. She exhibits no distension and no mass. There is no hepatosplenomegaly. There is no tenderness. There is no rebound and no guarding.  Musculoskeletal: She exhibits no edema or tenderness.  Lymphadenopathy:       Head (right side): No tonsillar adenopathy present.       Head (left side): No tonsillar adenopathy present.    She has no cervical adenopathy.    She has no axillary adenopathy.  Neurological: She is alert. Gait normal.  Skin: No rash (Scale external auditory canal bilaterally) noted. She is not diaphoretic. No erythema. No pallor. Nails show no clubbing.  Psychiatric: Mood and affect normal.    Diagnostics: Allergy skin  tests were performed.  She did not demonstrate any hypersensitivity against a screening panel of various mammals.  Review blood tests obtained 21 December 2016 identified normal hepatic and renal function, WBC 5.8, absolute lymphocyte 1700, absolute eosinophil 100, hemoglobin 12.0, platelet 288  Assessment and Plan:    1. Allergic urticaria   2. Food allergy   3. Other allergic rhinitis   4. Inflammatory dermatosis     1.  Allergen avoidance measures  2.  Blood - alpha gal panel  3.  Treat and prevent inflammation:   A.  Use topical steroid prescribed by dermatologist for ear eczema  B.  OTC Nasacort/Rhinocort 1 spray each nostril 3-7 times per week  4.  Continue OTC antihistamine if needed  5.  Epinephrine autoinjector device?  6.  Food challenge?  [place] had some form of immunological hyperreactivity earlier this spring and I think we need to make sure she does not have alpha gal syndrome and we will check for IgE antibodies directed against alpha gal and mammals as noted above.  If she does have a positive alpha gal  then we will give her a injectable epinephrine device.  If she does not have antibodies against alpha gal we will challenge her to mammal in the clinic.  For her upper airway atopic disease and external auditory canal inflammatory condition she can utilize a combination of therapy to address this inflammation.  Jiles Prows, MD Allergy / Immunology [facility]

## 2017-04-10 NOTE — Patient Instructions (Addendum)
  1.  Allergen avoidance measures  2.  Blood - alpha gal panel  3.  Treat and prevent inflammation:   A.  Use topical steroid prescribed by dermatologist for ear eczema  B.  OTC Nasacort/Rhinocort 1 spray each nostril 3-7 times per week  4.  Continue OTC antihistamine if needed  5.  Epinephrine autoinjector device?  6.  Food challenge?

## 2017-04-11 ENCOUNTER — Encounter: Payer: Self-pay | Admitting: Allergy and Immunology

## 2017-04-15 LAB — ALPHA-GAL PANEL
Beef (Bos spp) IgE: 0.25 kU/L (ref <0.35)
LAMB/MUTTON (OVIS SPP) IGE: 0.21 kU/L (ref <0.35)
PORK CLASS INTERPRETATION: 0
Pork (Sus spp) IgE: <0.1 kU/L (ref <0.35)

## 2017-05-06 ENCOUNTER — Ambulatory Visit: Payer: 59 | Admitting: Oncology

## 2017-05-21 ENCOUNTER — Other Ambulatory Visit: Payer: Self-pay | Admitting: Medical Oncology

## 2017-05-21 ENCOUNTER — Ambulatory Visit (HOSPITAL_BASED_OUTPATIENT_CLINIC_OR_DEPARTMENT_OTHER): Payer: 59 | Admitting: Oncology

## 2017-05-21 VITALS — BP 132/94 | HR 90 | Temp 98.2°F | Resp 18 | Ht 66.5 in | Wt 140.2 lb

## 2017-05-21 DIAGNOSIS — D0512 Intraductal carcinoma in situ of left breast: Secondary | ICD-10-CM

## 2017-05-21 NOTE — Progress Notes (Signed)
[facility]  Telephone:(336) [phone] Fax:(336) [phone]     ID: [name] DOB: Oct 05, 1960  MR#: [id number]  ATF#:[id number]  Patient Care Team: Leeroy Cha, MD as PCP - General (Internal Medicine) Dillingham, Loel Lofty, DO as Attending Physician (Plastic Surgery) Magrinat, Virgie Dad, MD as Consulting Physician (Oncology) [name], MD as Consulting Physician (General Surgery) Chauncey Cruel, MD OTHER MD:  CHIEF COMPLAINT: Ductal carcinoma in situ  CURRENT TREATMENT: Observation   BREAST CANCER HISTORY: From the original intake note:  "Debbie" had bilateral screening mammography with tomography at the [facility] 09/14/2016, showing the breast density to be category D. In the left breast there were new calcifications and on 09/20/2016 diagnostic mammography confirmed a group of suspicious calcifications in a linear distribution in the upper outer quadrant of the left breast measuring 1.8 cm. Breast density on the sixth tandem was described as C.  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  Of note, the patient has a history of left-sided ductal carcinoma in situ, estrogen and progesterone receptor positive, status post lumpectomy in 2006 under Dr. [name], followed by radiation then tamoxifen for 5 years completed April 2011.  Her subsequent history is as detailed below  INTERVAL HISTORY: Brandi Goodman returns today for follow-up of her ductal carcinoma in situ.  Since her last visit here she underwent left mastectomy with sentinel lymph node sampling, on 11/05/2016.  The final pathology ([id number]) showed residual ductal carcinoma in situ, grade 2, in the left breast, but negative margins and negative lymph node sampling (no lymph node tissue identified in the  specimen).  She then had some chest pain leading to CT angiogram on December 21, 2016.  This showed no evidence of pulmonary embolus, no acute abnormality, and some small pulmonary masses which had been noted in 2010 were entirely unchanged and therefore benign.  Subsequently on February 21, 2017 she had her definitive implant placed, with capsulotomies and right breast mastopexy.  The pathology from this procedure ([id number]) was benign  REVIEW OF SYSTEMS: Brandi Goodman had significant pain after her initial surgery, with the expander, but that has resolved.  She still has significant discomfort associated with the latissimus scar on the left.  She thinks she may benefit from physical therapy but did not want [place] to place that referral.  She will discussed this with Dr.Dillingham when they meet in January.  Otherwise she feels she is "getting back to normal".  A detailed review of systems today was noncontributory  PAST MEDICAL HISTORY: Past Medical History:  Diagnosis Date  . Anxiety    just lately  . Dental crown present   . Family history of adverse reaction to anesthesia    pt's father has hx. of post-op N/V  . History of breast cancer   . Hypothyroidism     PAST SURGICAL HISTORY: Past Surgical History:  Procedure Laterality Date  . BREAST EXCISIONAL BIOPSY Left 07/06/2004  . BREAST RECONSTRUCTION WITH PLACEMENT OF TISSUE EXPANDER AND FLEX HD (ACELLULAR HYDRATED DERMIS) Left 11/05/2016   Procedure: IMMEDIATE BREAST RECONSTRUCTION WITH PLACEMENT OF TISSUE EXPANDER AND LATISSIMUS FLAP;  Surgeon: [name], DO;  Location: [facility];  Service: Plastics;  Laterality: Left;  . CATARACT EXTRACTION W/ INTRAOCULAR LENS IMPLANT Right   . HYSTEROSCOPY W/D&C  02/13/2007; 04/09/2008  . LAPAROSCOPIC OOPHERECTOMY     left  ovary around 2008  . LAPAROSCOPIC SALPINGOOPHERECTOMY Left 05/07/2008  . LIPOSUCTION Bilateral 02/21/2017   Procedure: LIPOSUCTION;  Surgeon: [name], DO;  Location:  [facility];  Service: Plastics;  Laterality: Bilateral;  . MASTECTOMY W/ SENTINEL NODE BIOPSY Left 11/05/2016   Procedure: LEFT MASTECTOMY WITH SENTINEL LYMPH NODE BIOPSY;  Surgeon: [name], MD;  Location: [facility];  Service: General;  Laterality: Left;  Marland Kitchen MASTECTOMY, PARTIAL Left 05/01/2004  . MASTOPEXY Right 02/21/2017   Procedure: MASTOPEXY;  Surgeon: [name], DO;  Location: [facility];  Service: Plastics;  Laterality: Right;  . RE-EXCISION OF BREAST LUMPECTOMY Left 05/17/2004  . REMOVAL OF BILATERAL TISSUE EXPANDERS WITH PLACEMENT OF BILATERAL BREAST IMPLANTS Left 02/21/2017   Procedure: REMOVAL OF LEFT  TISSUE EXPANDERS WITH PLACEMENT OF BREAST IMPLANT;  Surgeon: [name], DO;  Location: [facility];  Service: Plastics;  Laterality: Left;    FAMILY HISTORY Family History  Problem Relation Age of Onset  . Anesthesia problems Father        post-op N/V  The patient's parents are still living as of May 2018. The patient's mother underwent mastectomy for precancerous lesion remotely. The patient is an only child  GYNECOLOGIC HISTORY:  No LMP recorded. Patient is postmenopausal. Menarche age 3, first live birth age 89, she is [place] P2. She underwent menopause in her late 55s. She did not take hormone replacement  SOCIAL HISTORY:  Brandi Goodman works in an office. Her husband [name] works in Psychologist, educational. Son [name] works in [employer] in [place]. 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 The patient is expecting her first grandchild 10/30/2016. Brandi Goodman attends a [employer] in [place].    ADVANCED DIRECTIVES:    HEALTH MAINTENANCE: Social History   Tobacco Use  . Smoking status: Never Smoker  . Smokeless tobacco: Never Used  Substance Use Topics  . Alcohol use: No  . Drug use: No     Colonoscopy:  PAP:  Bone density: Z score -1.3 06/14/2011   No Active Allergies  Current  Outpatient Medications  Medication Sig Dispense Refill  . aspirin EC 81 MG tablet Take by mouth.    . cetirizine (ZYRTEC) 10 MG tablet Take 10 mg by mouth.    . levothyroxine (SYNTHROID, LEVOTHROID) 125 MCG tablet Take 125 mcg by mouth daily before breakfast.    . Multiple Vitamin (MULTIVITAMIN) tablet Take 1 tablet by mouth daily.    . DOCOSAHEXAENOIC ACID PO Take 1 g by mouth.     No current facility-administered medications for this visit.     OBJECTIVE: Middle-aged white woman who appears stated age  31:   05/21/17 1542  BP: (!) 132/94  Pulse: 90  Resp: 18  Temp: 98.2 F (36.8 C)  SpO2: 98%     Body mass index is 22.29 kg/m.    ECOG FS:1 - Symptomatic but completely ambulatory  Sclerae unicteric, pupils round and equal Oropharynx clear and moist No cervical or supraclavicular adenopathy Lungs no rales or rhonchi Heart regular rate and rhythm Abd soft, nontender, positive bowel sounds MSK no focal spinal tenderness, no upper extremity lymphedema Neuro: nonfocal, well oriented, appropriate affect Breasts: The right breast is status post mastopexy.  The contour is well preserved.  It has healed nicely.  The left breast is status post mastectomy with implant reconstruction and latissimus flap.  It is somewhat smaller than the right.  The contour is somewhat flat.  Both axillae are benign.  LAB RESULTS:  CMP     Component Value Date/Time   NA 141 12/21/2016 1729   NA 140 10/09/2016 1553   K 3.6 12/21/2016 1729   K 3.8 10/09/2016 1553   CL 104 12/21/2016 1729   CO2 29 12/21/2016 1729   CO2 29 10/09/2016 1553   GLUCOSE 111 (H) 12/21/2016 1729   GLUCOSE 89 10/09/2016 1553   BUN 13 12/21/2016 1729   BUN 16.3 10/09/2016 1553   CREATININE 1.24 (H) 12/21/2016 1729   CREATININE 1.3 (H) 10/09/2016 1553   CALCIUM 9.8 12/21/2016 1729   CALCIUM 9.8 10/09/2016 1553   PROT 7.4 12/21/2016 1729   PROT 7.3 10/09/2016 1553   ALBUMIN 4.2 12/21/2016 1729   ALBUMIN 4.2  10/09/2016 1553   AST 16 12/21/2016 1729   AST 16 10/09/2016 1553   ALT 10 (L) 12/21/2016 1729   ALT 11 10/09/2016 1553   ALKPHOS 47 12/21/2016 1729   ALKPHOS 50 10/09/2016 1553   BILITOT 0.7 12/21/2016 1729   BILITOT 0.43 10/09/2016 1553   GFRNONAA 48 (L) 12/21/2016 1729   GFRAA 55 (L) 12/21/2016 1729    No results found for: Ronnald Ramp, A1GS, A2GS, BETS, BETA2SER, GAMS, MSPIKE, SPEI  No results found for: Nils Pyle, [facility]  Lab Results  Component Value Date   WBC 5.8 12/21/2016   NEUTROABS 3.8 12/21/2016   HGB 12.0 12/21/2016   HCT 38.1 12/21/2016   MCV 99.2 12/21/2016   PLT 288 12/21/2016      Chemistry      Component Value Date/Time   NA 141 12/21/2016 1729   NA 140 10/09/2016 1553   K 3.6 12/21/2016 1729   K 3.8 10/09/2016 1553   CL 104 12/21/2016 1729   CO2 29 12/21/2016 1729   CO2 29 10/09/2016 1553   BUN 13 12/21/2016 1729   BUN 16.3 10/09/2016 1553   CREATININE 1.24 (H) 12/21/2016 1729   CREATININE 1.3 (H) 10/09/2016 1553      Component Value Date/Time   CALCIUM 9.8 12/21/2016 1729   CALCIUM 9.8 10/09/2016 1553   ALKPHOS 47 12/21/2016 1729   ALKPHOS 50 10/09/2016 1553   AST 16 12/21/2016 1729   AST 16 10/09/2016 1553   ALT 10 (L) 12/21/2016 1729   ALT 11 10/09/2016 1553   BILITOT 0.7 12/21/2016 1729   BILITOT 0.43 10/09/2016 1553       No results found for: LABCA2  No components found for: [id number]  No results for input(s): INR in the last 168 hours.  Urinalysis No results found for: COLORURINE, APPEARANCEUR, LABSPEC, PHURINE, GLUCOSEU, HGBUR, BILIRUBINUR, KETONESUR, PROTEINUR, UROBILINOGEN, NITRITE, LEUKOCYTESUR   STUDIES: CT results discussed with the patient  ELIGIBLE FOR AVAILABLE RESEARCH PROTOCOL: not elegible for COMET (recurrent disease)  ASSESSMENT: 56 y.o. [place], [place] woman  (1) status post left lumpectomy 2006 for ductal carcinoma in situ, estrogen and progesterone receptor  positive  (2) status post adjuvant radiation to the left breast  (3) received tamoxifen for 5 years, completed April 2011  (4) status post left breast biopsy 09/24/2016 showing ductal carcinoma in situ, grade 2, estrogen and progesterone receptor positive  (5) left mastectomy with sentinel lymph node sampling 11/05/2016 showed ductal carcinoma in situ grade 2, with ample margins (pTis pNX)  (6) immediate reconstruction with expander and latissimus flap, then implant placement and right mammoplasty February 21, 2017  PLAN: Brandi Goodman is satisfied with her surgery and does not want any more "touchups".  She does  have some residual discomfort associated with the latissimus scar and she will discuss with her plastic surgeon Dr. Marla Roe whether physical therapy would be helpful.  She is considering changing bras as well.  We reviewed the fact that ductal carcinoma in situ is cured with mastectomy and therefore she is done with this tumor.  Her risk of developing another breast cancer in the right breast is in the range of half percent per year at most.  Given the fact that she did take antiestrogens in the past resuming antiestrogens at this point would be of questionable utility and we are not pursuing that  Accordingly I am comfortable releasing her from follow-up.  I will be glad to see her at any point in the future if and when the need arises.  Chauncey Cruel, MD   05/21/2017 3:53 PM Medical Oncology and Hematology [facility] [address] [place], [place] [address] Tel. [phone]    Fax. [phone]

## 2017-05-29 ENCOUNTER — Ambulatory Visit: Payer: 59 | Admitting: Oncology

## 2018-02-11 ENCOUNTER — Other Ambulatory Visit: Payer: Self-pay | Admitting: Internal Medicine

## 2018-02-11 DIAGNOSIS — Z1231 Encounter for screening mammogram for malignant neoplasm of breast: Secondary | ICD-10-CM

## 2018-03-06 ENCOUNTER — Ambulatory Visit
Admission: RE | Admit: 2018-03-06 | Discharge: 2018-03-06 | Disposition: A | Discharge status: Home / Self Care | Payer: 59 | Source: Ambulatory Visit | Attending: Internal Medicine | Admitting: Internal Medicine

## 2018-03-06 DIAGNOSIS — Z1231 Encounter for screening mammogram for malignant neoplasm of breast: Secondary | ICD-10-CM

## 2018-03-18 IMAGING — MG DIGITAL DIAGNOSTIC UNILATERAL LEFT MAMMOGRAM
3 series · 3 of 3 positions shown · non-contrast
Comparison: Previous exam(s).

CLINICAL DATA: Left breast calcifications seen on most recent
screening mammography. Patient has a history of left breast cancer,
status post left lumpectomy.

EXAM:
DIGITAL DIAGNOSTIC LEFT MAMMOGRAM WITH CAD

[L CC]
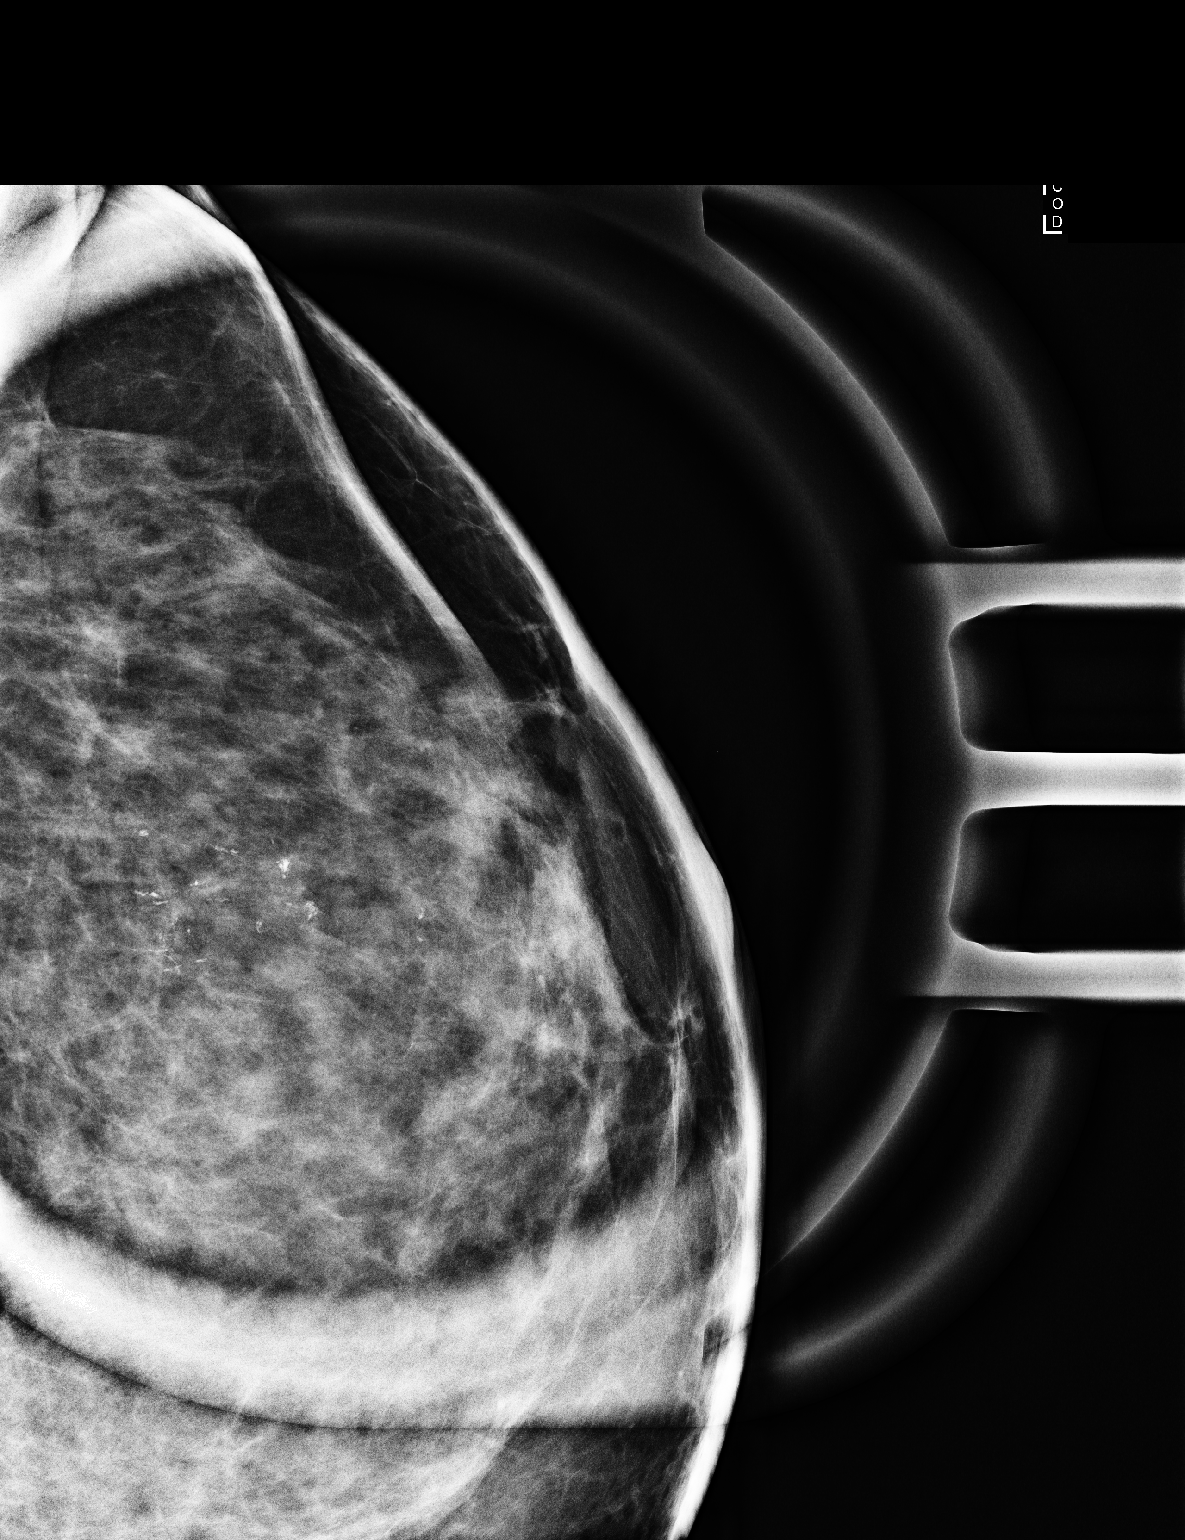

[L ML (1 of 2)]
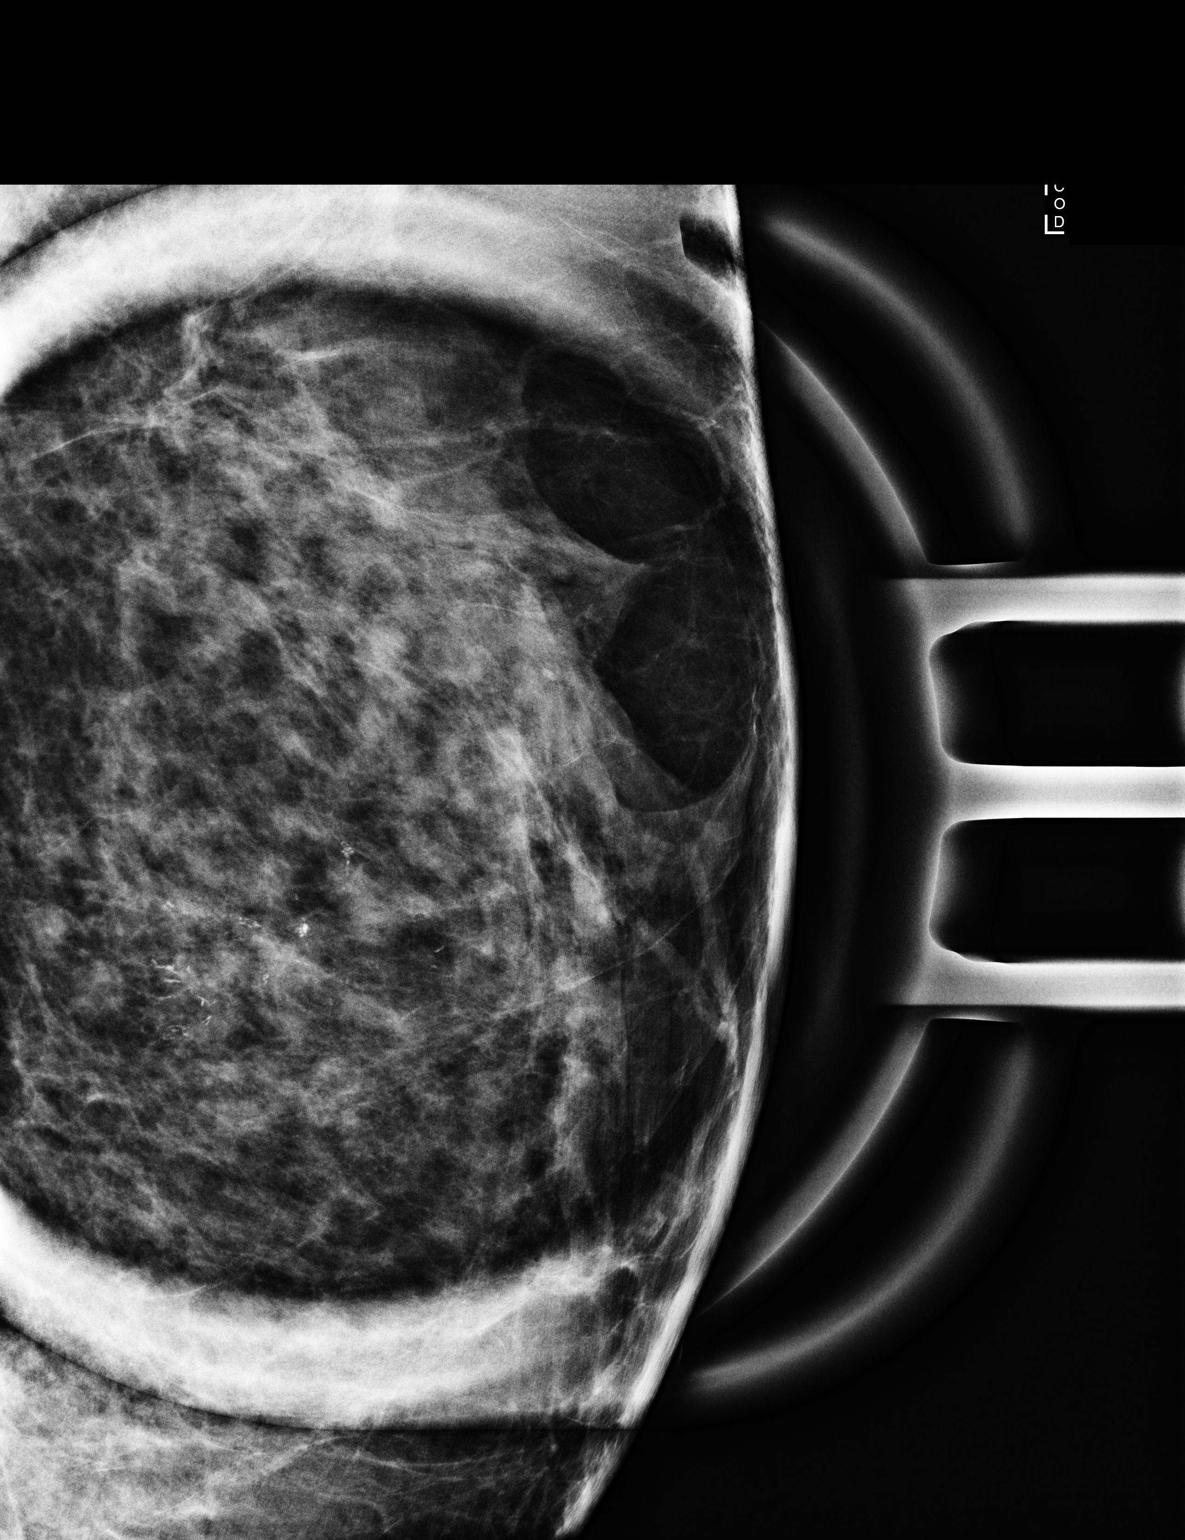

[L ML (2 of 2)]
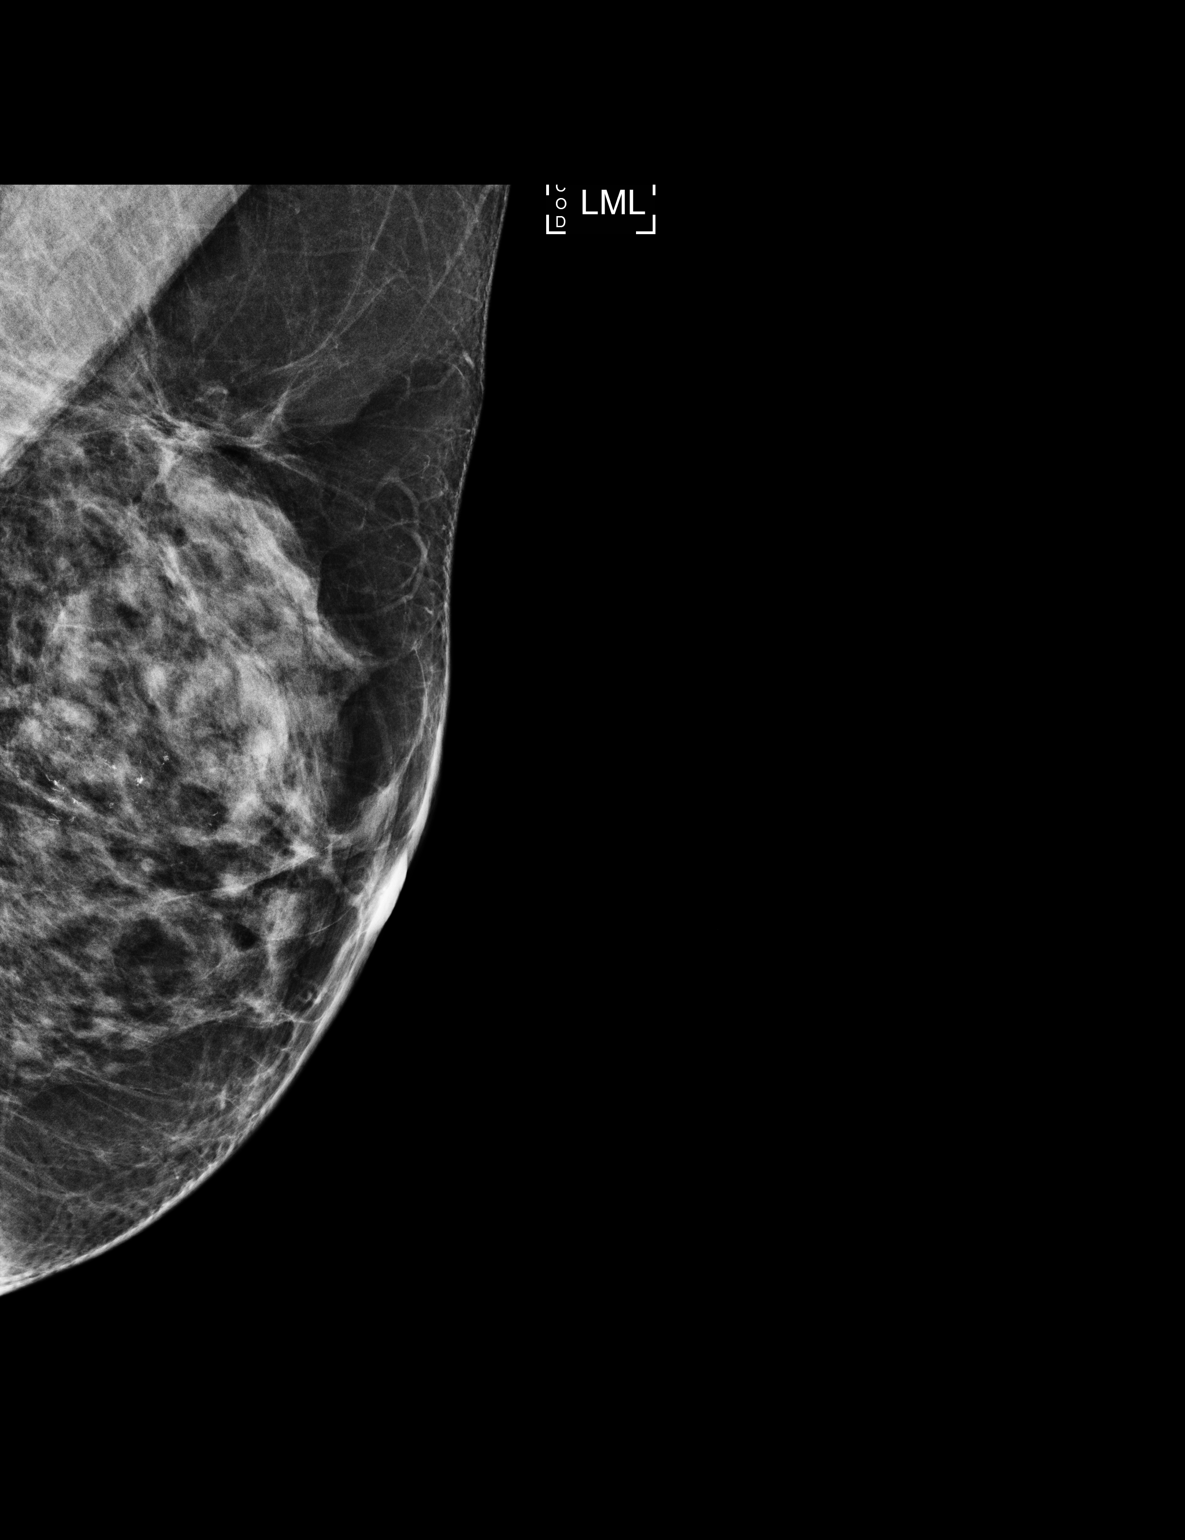

[3 of 3 positions shown; findings below may reference images not displayed]

ACR Breast Density Category c: The breast tissue is heterogeneously
dense, which may obscure small masses.
FINDINGS: There is a group of suspicious calcifications in linear distribution
in the left slightly upper outer quadrant, anterior depth, which
span 1.7 x 1.8 x 1.4 cm. There is no associated mass.

Mammographic images were processed with CAD.
IMPRESSION: Suspicious left breast calcifications, for which stereotactic core
needle biopsy is recommended.

RECOMMENDATION:
Stereotactic core needle biopsy of the left breast.

I have discussed the findings and recommendations with the patient.
Results were also provided in writing at the conclusion of the
visit. If applicable, a reminder letter will be sent to the patient
regarding the next appointment.

BI-RADS CATEGORY  4: Suspicious.

## 2018-05-03 IMAGING — CR DG CHEST 1V PORT
1 series · 1 of 1 positions shown · non-contrast
Comparison: Radiographs April 26, 2004.

CLINICAL DATA: Missing needle count.

EXAM:
PORTABLE CHEST 1 VIEW

[AP]
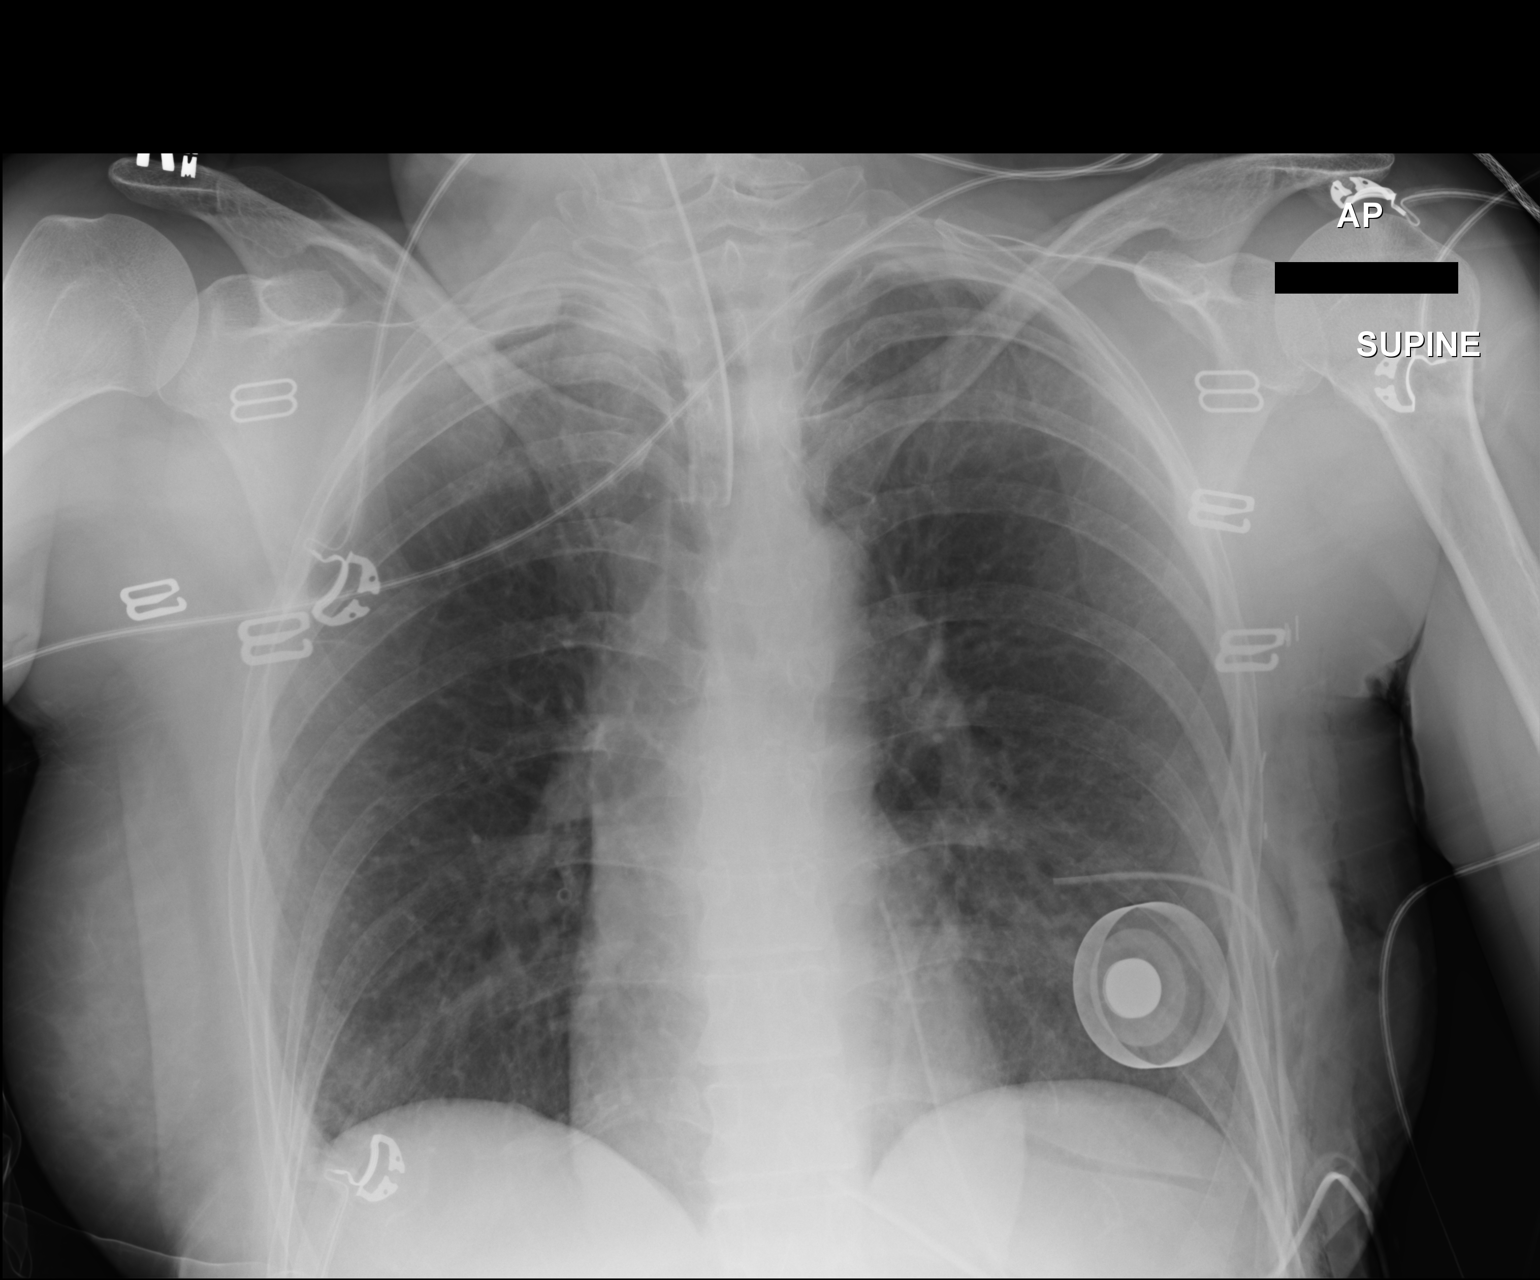

[1 of 1 positions shown; findings below may reference images not displayed]

FINDINGS: The heart size and mediastinal contours are within normal limits.
Both lungs are clear. The visualized skeletal structures are
unremarkable. Endotracheal tube is in grossly good position.
Probable surgical clips seen in the left axillary region as well as
overlying the left lateral chest wall. No definite curvilinear
density is seen to suggest a curved needle. Soft tissue gas is noted
in the left breast tissues as well as surgical drain.
IMPRESSION: Postsurgical changes are seen involving the left breast. Linear
metallic densities are seen the left axillary region and the soft
tissues overlying the left lateral chest wall most consistent
surgical clips. No curvilinear metallic density is seen to suggest
retained needle. These results were called by telephone at the time
of interpretation on 11/05/2016 at [DATE] to Melkys Eider, the
patient's nurse, who verbally acknowledged these results.

## 2018-09-09 ENCOUNTER — Ambulatory Visit: Payer: 59 | Admitting: Plastic Surgery

## 2018-09-19 ENCOUNTER — Ambulatory Visit: Payer: 59 | Admitting: Plastic Surgery

## 2019-04-14 ENCOUNTER — Other Ambulatory Visit: Payer: Self-pay | Admitting: Internal Medicine

## 2019-04-14 DIAGNOSIS — Z1231 Encounter for screening mammogram for malignant neoplasm of breast: Secondary | ICD-10-CM

## 2019-06-09 ENCOUNTER — Other Ambulatory Visit: Payer: Self-pay

## 2019-06-09 ENCOUNTER — Other Ambulatory Visit: Payer: Self-pay | Admitting: Internal Medicine

## 2019-06-09 ENCOUNTER — Ambulatory Visit
Admission: RE | Admit: 2019-06-09 | Discharge: 2019-06-09 | Disposition: A | Discharge status: Home / Self Care | Payer: 59 | Source: Ambulatory Visit | Attending: Internal Medicine | Admitting: Internal Medicine

## 2019-06-09 DIAGNOSIS — Z1231 Encounter for screening mammogram for malignant neoplasm of breast: Secondary | ICD-10-CM

## 2019-08-11 ENCOUNTER — Other Ambulatory Visit: Payer: Self-pay | Admitting: Internal Medicine

## 2019-08-11 DIAGNOSIS — M8000XA Age-related osteoporosis with current pathological fracture, unspecified site, initial encounter for fracture: Secondary | ICD-10-CM

## 2019-09-07 ENCOUNTER — Other Ambulatory Visit: Payer: Self-pay

## 2019-09-07 ENCOUNTER — Ambulatory Visit
Admission: RE | Admit: 2019-09-07 | Discharge: 2019-09-07 | Disposition: A | Discharge status: Home / Self Care | Payer: 59 | Source: Ambulatory Visit | Attending: Internal Medicine | Admitting: Internal Medicine

## 2019-09-07 DIAGNOSIS — M8000XA Age-related osteoporosis with current pathological fracture, unspecified site, initial encounter for fracture: Secondary | ICD-10-CM

## 2020-07-26 ENCOUNTER — Other Ambulatory Visit: Payer: Self-pay | Admitting: Internal Medicine

## 2020-07-26 DIAGNOSIS — Z1231 Encounter for screening mammogram for malignant neoplasm of breast: Secondary | ICD-10-CM

## 2020-09-15 ENCOUNTER — Ambulatory Visit
Admission: RE | Admit: 2020-09-15 | Discharge: 2020-09-15 | Disposition: A | Discharge status: Home / Self Care | Payer: 59 | Source: Ambulatory Visit | Attending: Internal Medicine | Admitting: Internal Medicine

## 2020-09-15 ENCOUNTER — Other Ambulatory Visit: Payer: Self-pay

## 2020-09-15 DIAGNOSIS — Z1231 Encounter for screening mammogram for malignant neoplasm of breast: Secondary | ICD-10-CM

## 2021-01-20 ENCOUNTER — Ambulatory Visit (INDEPENDENT_AMBULATORY_CARE_PROVIDER_SITE_OTHER): Payer: 59 | Admitting: Plastic Surgery

## 2021-01-20 ENCOUNTER — Encounter: Payer: Self-pay | Admitting: Plastic Surgery

## 2021-01-20 ENCOUNTER — Other Ambulatory Visit: Payer: Self-pay

## 2021-01-20 DIAGNOSIS — R222 Localized swelling, mass and lump, trunk: Secondary | ICD-10-CM | POA: Insufficient documentation

## 2021-01-20 DIAGNOSIS — D0512 Intraductal carcinoma in situ of left breast: Secondary | ICD-10-CM | POA: Diagnosis not present

## 2021-01-20 NOTE — Progress Notes (Signed)
Patient ID: Brandi Goodman, female    DOB: 1961-04-17, 60 y.o.   MRN: [id number]   Chief Complaint  Patient presents with   Follow-up    The patient is a 60 year old female here for evaluation of her left breast and left back.  The patient underwent a left mastectomy in 2018.  She had reconstruction with a latissimus muscle flap and implant.  She has a 455 cc ultrahigh profile silicone implant.  She has been doing really well.  She noticed some tightness in her left shoulder and some tenderness.  She also noticed a little mass on the back and she was not sure if it was anything of concern.  It does not hurt and does not seem to be getting any larger.  It is approximately 2 cm in the mid medial aspect of the left back.  It soft and feels like it is normal fatty tissue.  Its not painful to palpation.  There is no redness or area for infection concern.   Review of Systems  Constitutional: Negative.   Eyes: Negative.   Respiratory: Negative.  Negative for chest tightness and shortness of breath.   Cardiovascular:  Negative for leg swelling.  Gastrointestinal:  Negative for abdominal distention.  Endocrine: Negative.   Genitourinary: Negative.   Musculoskeletal: Negative.   Skin:  Negative for color change and wound.  Hematological: Negative.   Psychiatric/Behavioral: Negative.     Past Medical History:  Diagnosis Date   Anxiety    just lately   Dental crown present    Family history of adverse reaction to anesthesia    pt's father has hx. of post-op N/V   History of breast cancer    Hypothyroidism     Past Surgical History:  Procedure Laterality Date   BREAST EXCISIONAL BIOPSY Left 07/06/2004   BREAST RECONSTRUCTION WITH PLACEMENT OF TISSUE EXPANDER AND FLEX HD (ACELLULAR HYDRATED DERMIS) Left 11/05/2016   Procedure: IMMEDIATE BREAST RECONSTRUCTION WITH PLACEMENT OF TISSUE EXPANDER AND LATISSIMUS FLAP;  Surgeon: [name], DO;  Location: [place];  Service: Plastics;   Laterality: Left;   CATARACT EXTRACTION W/ INTRAOCULAR LENS IMPLANT Right    HYSTEROSCOPY WITH D & C  02/13/2007; 04/09/2008   LAPAROSCOPIC OOPHERECTOMY     left ovary around 2008   LAPAROSCOPIC SALPINGOOPHERECTOMY Left 05/07/2008   LIPOSUCTION Bilateral 02/21/2017   Procedure: LIPOSUCTION;  Surgeon: [name], DO;  Location: [place];  Service: Plastics;  Laterality: Bilateral;   MASTECTOMY W/ SENTINEL NODE BIOPSY Left 11/05/2016   Procedure: LEFT MASTECTOMY WITH SENTINEL LYMPH NODE BIOPSY;  Surgeon: [name], MD;  Location: [place];  Service: General;  Laterality: Left;   MASTECTOMY, PARTIAL Left 05/01/2004   MASTOPEXY Right 02/21/2017   Procedure: MASTOPEXY;  Surgeon: [name], DO;  Location: [facility];  Service: Plastics;  Laterality: Right;   RE-EXCISION OF BREAST LUMPECTOMY Left 05/17/2004   REMOVAL OF BILATERAL TISSUE EXPANDERS WITH PLACEMENT OF BILATERAL BREAST IMPLANTS Left 02/21/2017   Procedure: REMOVAL OF LEFT  TISSUE EXPANDERS WITH PLACEMENT OF BREAST IMPLANT;  Surgeon: [name], DO;  Location: [facility];  Service: Plastics;  Laterality: Left;      Current Outpatient Medications:    cetirizine (ZYRTEC) 10 MG tablet, Take 10 mg by mouth., Disp: , Rfl:    DOCOSAHEXAENOIC ACID PO, Take 1 g by mouth., Disp: , Rfl:    levothyroxine (SYNTHROID, LEVOTHROID) 125 MCG tablet, Take 125  mcg by mouth daily before breakfast., Disp: , Rfl:    Multiple Vitamin (MULTIVITAMIN) tablet, Take 1 tablet by mouth daily., Disp: , Rfl:    Objective:   There were no vitals filed for this visit.  Physical Exam Vitals and nursing note reviewed.  Constitutional:      Appearance: Normal appearance.  HENT:     Head: Normocephalic and atraumatic.  Cardiovascular:     Rate and Rhythm: Normal rate.     Pulses: Normal pulses.  Pulmonary:     Effort: Pulmonary effort is normal. No respiratory distress.     Breath sounds:  No wheezing.  Abdominal:     General: Abdomen is flat. There is no distension.     Palpations: There is no mass.     Tenderness: There is no guarding.  Skin:    Capillary Refill: Capillary refill takes less than 2 seconds.  Neurological:     General: No focal deficit present.     Mental Status: She is alert and oriented to person, place, and time.    Assessment & Plan:  Ductal carcinoma in situ of left breast  Mass of skin of back  Plan for physical therapy to see if we can help with range of motion and decrease the tenderness of the left shoulder.  Ultrasound of left breast implant and left back.  We will plan to do a 1 year follow-up and also a 1 month telemetry visit to evaluate her progress.  She knows if she has any questions or concerns to call me at any time.  It was great to see her.  Pictures were obtained of the patient and placed in the chart with the patient's or guardian's permission.   [place], DO

## 2021-01-24 ENCOUNTER — Telehealth: Payer: Self-pay

## 2021-01-24 NOTE — Telephone Encounter (Signed)
Ultrasound of breast [id number]) and spine [id number]) with dx codes D05.12 and R22.2  -No PA required per [facility] at [place] [phone]) Ref: [phone]

## 2021-01-31 ENCOUNTER — Ambulatory Visit
Admission: RE | Admit: 2021-01-31 | Discharge: 2021-01-31 | Disposition: A | Discharge status: Home / Self Care | Payer: 59 | Source: Ambulatory Visit | Attending: Plastic Surgery | Admitting: Plastic Surgery

## 2021-01-31 DIAGNOSIS — D0512 Intraductal carcinoma in situ of left breast: Secondary | ICD-10-CM

## 2021-01-31 DIAGNOSIS — R222 Localized swelling, mass and lump, trunk: Secondary | ICD-10-CM

## 2021-02-02 ENCOUNTER — Ambulatory Visit
Admission: RE | Admit: 2021-02-02 | Discharge: 2021-02-02 | Disposition: A | Discharge status: Home / Self Care | Payer: 59 | Source: Ambulatory Visit | Attending: Plastic Surgery | Admitting: Plastic Surgery

## 2021-02-02 ENCOUNTER — Other Ambulatory Visit: Payer: Self-pay

## 2021-02-02 DIAGNOSIS — D0512 Intraductal carcinoma in situ of left breast: Secondary | ICD-10-CM

## 2021-02-02 DIAGNOSIS — R222 Localized swelling, mass and lump, trunk: Secondary | ICD-10-CM

## 2021-02-13 ENCOUNTER — Other Ambulatory Visit: Payer: Self-pay

## 2021-02-13 ENCOUNTER — Ambulatory Visit: Payer: 59 | Attending: Plastic Surgery

## 2021-02-13 DIAGNOSIS — M6281 Muscle weakness (generalized): Secondary | ICD-10-CM

## 2021-02-13 DIAGNOSIS — R293 Abnormal posture: Secondary | ICD-10-CM | POA: Diagnosis not present

## 2021-02-13 DIAGNOSIS — Z483 Aftercare following surgery for neoplasm: Secondary | ICD-10-CM | POA: Diagnosis present

## 2021-02-13 DIAGNOSIS — M25612 Stiffness of left shoulder, not elsewhere classified: Secondary | ICD-10-CM | POA: Diagnosis present

## 2021-02-13 DIAGNOSIS — D0512 Intraductal carcinoma in situ of left breast: Secondary | ICD-10-CM | POA: Insufficient documentation

## 2021-02-13 NOTE — Therapy (Signed)
[place], [place], [address] Phone: [phone]   Fax:  [phone]  Physical Therapy Evaluation  Patient Details  Name: Brandi Goodman MRN: [id number] Date of Birth: 05-Oct-1960 Referring Provider (PT): Dr. Marla Roe   Encounter Date: 02/13/2021   PT End of Session - 02/13/21 1743     Visit Number 1    Number of Visits 12    Date for PT Re-Evaluation 03/27/21    PT Start Time 1503    PT Stop Time 1558    PT Time Calculation (min) 55 min    Activity Tolerance Patient tolerated treatment well    Behavior During Therapy [facility] for tasks assessed/performed             Past Medical History:  Diagnosis Date   Anxiety    just lately   Dental crown present    Family history of adverse reaction to anesthesia    pt's father has hx. of post-op N/V   History of breast cancer    Hypothyroidism     Past Surgical History:  Procedure Laterality Date   BREAST EXCISIONAL BIOPSY Left 07/06/2004   BREAST RECONSTRUCTION WITH PLACEMENT OF TISSUE EXPANDER AND FLEX HD (ACELLULAR HYDRATED DERMIS) Left 11/05/2016   Procedure: IMMEDIATE BREAST RECONSTRUCTION WITH PLACEMENT OF TISSUE EXPANDER AND LATISSIMUS FLAP;  Surgeon: [name], DO;  Location: [place];  Service: Plastics;  Laterality: Left;   CATARACT EXTRACTION W/ INTRAOCULAR LENS IMPLANT Right    HYSTEROSCOPY WITH D & C  02/13/2007; 04/09/2008   LAPAROSCOPIC OOPHERECTOMY     left ovary around 2008   LAPAROSCOPIC SALPINGOOPHERECTOMY Left 05/07/2008   LIPOSUCTION Bilateral 02/21/2017   Procedure: LIPOSUCTION;  Surgeon: [name], DO;  Location: [place];  Service: Plastics;  Laterality: Bilateral;   MASTECTOMY W/ SENTINEL NODE BIOPSY Left 11/05/2016   Procedure: LEFT MASTECTOMY WITH SENTINEL LYMPH NODE BIOPSY;  Surgeon: [name], MD;  Location: [place];  Service: General;  Laterality: Left;   MASTECTOMY, PARTIAL Left  05/01/2004   MASTOPEXY Right 02/21/2017   Procedure: MASTOPEXY;  Surgeon: [name], DO;  Location: [place];  Service: Plastics;  Laterality: Right;   RE-EXCISION OF BREAST LUMPECTOMY Left 05/17/2004   REMOVAL OF BILATERAL TISSUE EXPANDERS WITH PLACEMENT OF BILATERAL BREAST IMPLANTS Left 02/21/2017   Procedure: REMOVAL OF LEFT  TISSUE EXPANDERS WITH PLACEMENT OF BREAST IMPLANT;  Surgeon: [name], DO;  Location: [place];  Service: Plastics;  Laterality: Left;    There were no vitals filed for this visit.    Subjective Assessment - 02/13/21 1507     Subjective Pt had surgery on 11/05/2016 for left mastectomy with SLNB and reconstruction with Lat flap and implant.  Pt reports her left upper back gets painful with everyday tasks. Pt is left handed.Mopping the floor, sometimes driving bothers her. Using her arms at shoulder height is fatiguing. sometimes feels like it is cramping.  She has a knot on her left upper back and she isn't sure if it is new or not, but they ultrasounded it and she didn't think they saw anything. She can sit all day and use her computer without pain. She notes that despite her left arm being dominant she feels weak on that side. Pain has been present for atleast 6 months   Pertinent History Pt had a left lumpectoy on 05/01/2004 with reexcision on 05/17/2004.  She did not have chemo at  the time, but did have radiation. On 11/05/2016 she had a mastectomy with SLNB with immediate reconstruction and lat flap secondary to reoccurence. She did not have PT after that.    How long can you sit comfortably? unlimited    How long can you walk comfortably? walking and shopping left upper back fatigues. Can't shop as long    Diagnostic tests [place] chest/ and back ; post op changes only   Patient Stated Goals Be able to do normal activities without increased pain    Currently in Pain? Yes    Pain Score 2     Pain Location Thoracic     Pain Orientation Left   Below shoulder blade   Pain Descriptors / Indicators Burning;Tightness    Pain Type Chronic pain    Pain Onset More than a month ago    Pain Frequency Intermittent    Aggravating Factors  household activities, mopping, washing dishes, driving    Pain Relieving Factors laying down, reclining    Effect of Pain on Daily Activities Makes her limit what she is doing    Multiple Pain Sites No                [facility] PT Assessment - 02/13/21 0001       Assessment   Referring Provider (PT) Dr. Marla Roe    Onset Date/Surgical Date 11/05/16    Hand Dominance Left    Prior Therapy no      Precautions   Precaution Comments lymphedema risk      Balance Screen   Has the patient fallen in the past 6 months No    Has the patient had a decrease in activity level because of a fear of falling?  No    Is the patient reluctant to leave their home because of a fear of falling?  No      Home Ecologist residence    Living Arrangements Spouse/significant other    Available Help at Discharge Family      Prior Function   Level of Independence Independent    Vocation Full time employment    Vocation Requirements office mgr    Leisure fishing,      Cognition   Overall Cognitive Status Within Functional Limits for tasks assessed      Observation/Other Assessments   Observations increased thoracic kyphosis,scoliosis, posible lipoma left upper back    Skin Integrity WNL      Posture/Postural Control   Posture/Postural Control Postural limitations    Postural Limitations Rounded Shoulders;Forward head;Increased thoracic kyphosis      AROM   Right Shoulder Extension 53 Degrees    Right Shoulder Flexion 160 Degrees    Right Shoulder ABduction 170 Degrees    Right Shoulder Internal Rotation --   FIR T4   Right Shoulder External Rotation 92 Degrees    Left Shoulder Extension 40 Degrees    Left Shoulder Flexion 130 Degrees   feels tight  under arm/behind shoulder   Left Shoulder ABduction 106 Degrees    Left Shoulder Internal Rotation --   FIR T 7   Left Shoulder External Rotation 81 Degrees    Lumbar Flexion WFL    Lumbar Extension decreased 40% no pain    Lumbar - Right Side Bend WFL    Lumbar - Right Rotation WFL    Lumbar - Left Rotation created burning no worse    Thoracic Flexion WFL no pain.      Strength  Right Shoulder Flexion 4/5    Right Shoulder ABduction 4/5    Right Shoulder Internal Rotation 4+/5    Right Shoulder External Rotation 4/5    Left Shoulder Flexion 3+/5    Left Shoulder ABduction 3+/5    Left Shoulder Internal Rotation 3+/5    Left Shoulder External Rotation 3+/5      Palpation   Palpation comment mildly tender inferior border of scapula               LYMPHEDEMA/ONCOLOGY QUESTIONNAIRE - 02/13/21 0001       Type   Cancer Type Left Breast Cancer      Surgeries   Mastectomy Date 11/05/16    Lumpectomy Date 05/01/04    Other Surgery Date 05/17/04   Reexcision     Treatment   Active Chemotherapy Treatment Yes    Past Chemotherapy Treatment No    Active Radiation Treatment No    Past Radiation Treatment Yes    Date --   2005   Current Hormone Treatment No    Past Hormone Therapy Yes    Drug Name Tamoxifen      What other symptoms do you have   Are you Having Heaviness or Tightness Yes    Are you having Pain Yes    Are you having pitting edema No    Is it Hard or Difficult finding clothes that fit Yes    Do you have infections Yes      Left Upper Extremity Lymphedema   15 cm Proximal to Olecranon Process 25.8 cm    Olecranon Process 23.2 cm    15 cm Proximal to Ulnar Styloid Process 21.2 cm    Just Proximal to Ulnar Styloid Process 15.5 cm    At Base of 2nd Digit 6 cm                   Quick Dash - 02/13/21 0001     Open a tight or new jar Moderate difficulty    Do heavy household chores (wash walls, wash floors) Moderate difficulty    Carry a  shopping bag or briefcase No difficulty    Wash your back Mild difficulty    Use a knife to cut food No difficulty    Recreational activities in which you take some force or impact through your arm, shoulder, or hand (golf, hammering, tennis) Mild difficulty    During the past week, to what extent has your arm, shoulder or hand problem interfered with your normal social activities with family, friends, neighbors, or groups? Slightly    During the past week, to what extent has your arm, shoulder or hand problem limited your work or other regular daily activities Not at all    Arm, shoulder, or hand pain. None    Tingling (pins and needles) in your arm, shoulder, or hand Mild    Difficulty Sleeping Mild difficulty    DASH Score 20.45 %              Objective measurements completed on examination: See above findings.                     PT Long Term Goals - 02/13/21 1755       PT LONG TERM GOAL #1   Title Pt will be independent and compliant with HEP for shoulder ROM, strength, stability and posture    Time 4    Period Weeks    Status New  Target Date 03/13/21      PT LONG TERM GOAL #2   Title Pt will have decreased left thoracic pain by atleast 50% for improved ability to perform home activities    Time 6    Period Weeks    Status New    Target Date 03/27/21      PT LONG TERM GOAL #3   Title Pts left UE will be improved to 4+/5 throughout in available ROM for improved stability and ability to perform home activities   Time 6    Period Weeks    Status New    Target Date 03/27/21      PT LONG TERM GOAL #4   Title Pts quick dash will be improved to no greater than 10%    Baseline 20%    Time 6    Period Weeks    Status New    Target Date 03/27/21                    Plan - 02/13/21 1745     Clinical Impression Statement Pt is s/p left lumpectomy in 2005 and left mastectomy with immediate reconstruction and lat flap on 11/05/2016.  she did  not have therapy following surgery.  She presents with complaints of left thoracic pain just below that shoulder blade that has been present for 6 months or longer.  She also has a small nodular area that feels like a possible Lipoma.  She has had chest and back [place] with nothing appearing abnormal other than post-surgical changes. She has very poor posture with increased thoracic kyphosis and scoliosis, forward head and rounded shoulders.  The left dominant shoulder is significantly limited with ROM and strength. Back testing was normal except for reproduction of burning sensation with Left rotation. She will benefit from skilled PT to address deficits and return to PLOF including shoulder ROM, strengthening and stabilization of thoracic region and shoulder.    Personal Factors and Comorbidities Comorbidity 2    Comorbidities left breast mastectomy with reconstruction and lat flap, increased thoracic kyphosis/scoliosis    Examination-Activity Limitations Reach Overhead;Lift    Examination-Participation Restrictions Cleaning    Stability/Clinical Decision Making Stable/Uncomplicated    Clinical Decision Making Low    Rehab Potential Good    PT Frequency 2x / week    PT Duration 6 weeks    PT Treatment/Interventions ADLs/Self Care Home Management;Therapeutic activities;Therapeutic exercise;Neuromuscular re-education;Manual techniques;Patient/family education;Scar mobilization;Passive range of motion    PT Next Visit Plan AAROM left shoulder, PROM, lat stretch, shoulder/thoracic stabs, scapular and postural strengthening. Assess left rotation prn    Consulted and Agree with Plan of Care Patient             Patient will benefit from skilled therapeutic intervention in order to improve the following deficits and impairments:  Decreased activity tolerance, Decreased mobility, Decreased strength, Postural dysfunction, Decreased scar mobility, Decreased knowledge of precautions, Pain, Impaired UE  functional use, Increased muscle spasms, Decreased range of motion  Visit Diagnosis: Abnormal posture  Aftercare following surgery for neoplasm  Muscle weakness (generalized)  Stiffness of left shoulder, not elsewhere classified  Ductal carcinoma in situ of left breast     Problem List Patient Active Problem List   Diagnosis Date Noted   Mass of skin of back 01/20/2021   Breast cancer ([place]) 11/05/2016   Ductal carcinoma in situ of left breast 10/09/2016   Anemia 05/22/2011    Claris Pong, PT 02/13/2021, 6:01 PM  [place], [place], [address] Phone: [phone]   Fax:  [phone]  Name: Brandi Goodman MRN: [id number] Date of Birth: 05-Apr-1961

## 2021-02-28 ENCOUNTER — Ambulatory Visit: Payer: 59 | Admitting: Physical Therapy

## 2021-03-01 ENCOUNTER — Encounter: Payer: 59 | Admitting: Rehabilitation

## 2021-03-07 ENCOUNTER — Encounter: Payer: 59 | Admitting: Physical Therapy

## 2021-03-09 ENCOUNTER — Encounter: Payer: 59 | Admitting: Rehabilitation

## 2021-03-13 ENCOUNTER — Other Ambulatory Visit: Payer: Self-pay

## 2021-03-13 ENCOUNTER — Ambulatory Visit: Payer: 59 | Attending: Plastic Surgery

## 2021-03-13 DIAGNOSIS — D0512 Intraductal carcinoma in situ of left breast: Secondary | ICD-10-CM | POA: Diagnosis present

## 2021-03-13 DIAGNOSIS — M25612 Stiffness of left shoulder, not elsewhere classified: Secondary | ICD-10-CM | POA: Insufficient documentation

## 2021-03-13 DIAGNOSIS — M6281 Muscle weakness (generalized): Secondary | ICD-10-CM | POA: Insufficient documentation

## 2021-03-13 DIAGNOSIS — Z483 Aftercare following surgery for neoplasm: Secondary | ICD-10-CM | POA: Insufficient documentation

## 2021-03-13 DIAGNOSIS — R293 Abnormal posture: Secondary | ICD-10-CM | POA: Insufficient documentation

## 2021-03-13 NOTE — Patient Instructions (Signed)
Side Waist Stretch from Child's Pose    From child's pose, walk hands to left. Reach right hand out on diagonal. Reach hips back toward heels making a C with torso. Breathe into right side waist. Hold for __3__ breaths. Repeat __3__ times each side.  Can also do this on desk at work.   Thoracic Self-Mobilization (Sitting)    With small rolled towel at lower ribs level, gently lean back until stretch is felt. Hold __20__ seconds. Relax. Repeat __3-5__ times per set. Do ___1_ sets per session. Do __2-3__ sessions per day.   In doorway, slide Lt arm up doorframe while pulling shoulder blade down until gentle stretch felt.  Hold for 10-20 sec, repeat 3-5 times  Cancer Rehab [phone]

## 2021-03-13 NOTE — Therapy (Signed)
[facility] @ [place], [place], [address] Phone: [phone]   Fax:  [phone]  Physical Therapy Treatment  Patient Details  Name: ELLIS KOFFLER MRN: [id number] Date of Birth: 1960/09/03 Referring Provider (PT): Dr. Marla Roe   Encounter Date: 03/13/2021   PT End of Session - 03/13/21 1758     Visit Number 2    Number of Visits 12    Date for PT Re-Evaluation 03/27/21    PT Start Time 1614    PT Stop Time 1713    PT Time Calculation (min) 59 min    Activity Tolerance Patient tolerated treatment well    Behavior During Therapy [facility] for tasks assessed/performed             Past Medical History:  Diagnosis Date   Anxiety    just lately   Dental crown present    Family history of adverse reaction to anesthesia    pt's father has hx. of post-op N/V   History of breast cancer    Hypothyroidism     Past Surgical History:  Procedure Laterality Date   BREAST EXCISIONAL BIOPSY Left 07/06/2004   BREAST RECONSTRUCTION WITH PLACEMENT OF TISSUE EXPANDER AND FLEX HD (ACELLULAR HYDRATED DERMIS) Left 11/05/2016   Procedure: IMMEDIATE BREAST RECONSTRUCTION WITH PLACEMENT OF TISSUE EXPANDER AND LATISSIMUS FLAP;  Surgeon: [name], DO;  Location: [facility];  Service: Plastics;  Laterality: Left;   CATARACT EXTRACTION W/ INTRAOCULAR LENS IMPLANT Right    HYSTEROSCOPY WITH D & C  02/13/2007; 04/09/2008   LAPAROSCOPIC OOPHERECTOMY     left ovary around 2008   LAPAROSCOPIC SALPINGOOPHERECTOMY Left 05/07/2008   LIPOSUCTION Bilateral 02/21/2017   Procedure: LIPOSUCTION;  Surgeon: [name], DO;  Location: [facility];  Service: Plastics;  Laterality: Bilateral;   MASTECTOMY W/ SENTINEL NODE BIOPSY Left 11/05/2016   Procedure: LEFT MASTECTOMY WITH SENTINEL LYMPH NODE BIOPSY;  Surgeon: [name], MD;  Location: [facility];  Service: General;  Laterality: Left;   MASTECTOMY, PARTIAL  Left 05/01/2004   MASTOPEXY Right 02/21/2017   Procedure: MASTOPEXY;  Surgeon: [name], DO;  Location: [facility];  Service: Plastics;  Laterality: Right;   RE-EXCISION OF BREAST LUMPECTOMY Left 05/17/2004   REMOVAL OF BILATERAL TISSUE EXPANDERS WITH PLACEMENT OF BILATERAL BREAST IMPLANTS Left 02/21/2017   Procedure: REMOVAL OF LEFT  TISSUE EXPANDERS WITH PLACEMENT OF BREAST IMPLANT;  Surgeon: [name], DO;  Location: [facility];  Service: Plastics;  Laterality: Left;    There were no vitals filed for this visit.   Subjective Assessment - 03/13/21 1616     Subjective I 'm sorry it's been so long since I was here for my evaluation. I couldn't get off work and then we were out of town. I went fishing at the beach this weekend so my Lt shoulder and the spot at my mid back started bothering me and I had to stop.    Pertinent History Pt had a left lumpectoy on 05/01/2004 with reexcision on 05/17/2004.  She did not have chemo at the time, but did have radiation. On 11/05/2016 she had a mastectomy with SLNB with immediate reconstruction and lat flap secondary to reoccurence. She did not have PT after that.    Currently in Pain? No/denies  [facility] Adult PT Treatment/Exercise - 03/13/21 0001       Exercises   Exercises Other Exercises    Other Exercises  Childs pose, thoracic ext seated over back of chair, and end Lt shoulder A/ROM in doorway      Manual Therapy   Manual Therapy Soft tissue mobilization;Myofascial release;Scapular mobilization;Passive ROM    Soft tissue mobilization In Supine to lateral trunk and lateral border of scapula, then in Rt S/L with cocoa butter along lat flap incision and to area of most tightness where trigger point palpable at superior aspect of incision, also briefly to Lt upper trap area    Myofascial Release To Lt pectoralis tendon in abduction and UE pulling during  P/ROM    Scapular Mobilization In Rt S/L into protraction and retraction, scapula depression throughout P/ROM as pt demonstrates muscle guarding that limits end motion    Passive ROM In Supine to Lt shoulder into flexion, abduction and D2 to pts available end ROM                     PT Education - 03/13/21 1756     Education Details Thoracic stretches and end Lt shoulder ROM stretching in doorway    Person(s) Educated Patient    Methods Explanation;Demonstration;Handout    Comprehension Verbalized understanding;Returned demonstration;Need further instruction                 PT Long Term Goals - 02/13/21 1755       PT LONG TERM GOAL #1   Title Pt will be independent and compliant with HEP for shoulder ROM, strength, stability and posture    Time 4    Period Weeks    Status New    Target Date 03/13/21      PT LONG TERM GOAL #2   Title Pt will have decreased left thoracic pain by atleast 50% for improved ability to perform home activities    Time 6    Period Weeks    Status New    Target Date 03/27/21      PT LONG TERM GOAL #3   Title Pts left UE will be improved to 4+/5 throughout in available ROM    Time 6    Period Weeks    Status New    Target Date 03/27/21      PT LONG TERM GOAL #4   Title Pts quick dash will be improved to no greater than 10%    Baseline 20%    Time 6    Period Weeks    Status New    Target Date 03/27/21                   Plan - 03/13/21 1806     Clinical Impression Statement Pt returns after eval being near 1 month ago as she couldn't get off from work and was out of town. Spent most of session working on manual therapy working to decrease muscluar tightness at L upper quadrant and improve her Lt shoulder end P/ROM. She requires multiple VCs to relax during sessionm dur to muscle guarding but is able to do so with cuing. Issued inital HEP for thoracic mobility/flexibility and Lt shoulder end motion stretching. She was  able to return good demo of each.    Personal Factors and Comorbidities Comorbidity 2    Comorbidities left breast mastectomy with reconstruction and lat flap, increased thoracic kyphosis/scoliosis    Examination-Activity Limitations Reach Overhead;Lift  Examination-Participation Restrictions Cleaning    Stability/Clinical Decision Making Stable/Uncomplicated    Rehab Potential Good    PT Frequency 2x / week    PT Duration 6 weeks    PT Treatment/Interventions ADLs/Self Care Home Management;Therapeutic activities;Therapeutic exercise;Neuromuscular re-education;Manual techniques;Patient/family education;Scar mobilization;Passive range of motion    PT Next Visit Plan Consider trying supine scapular series;  AAROM left shoulder with pulleys assessing technique, cont PROM of Lt shoulder, lat stretch, shoulder/thoracic stabs, scapular and postural strengthening    Consulted and Agree with Plan of Care Patient             Patient will benefit from skilled therapeutic intervention in order to improve the following deficits and impairments:  Decreased activity tolerance, Decreased mobility, Decreased strength, Postural dysfunction, Decreased scar mobility, Decreased knowledge of precautions, Pain, Impaired UE functional use, Increased muscle spasms, Decreased range of motion  Visit Diagnosis: Abnormal posture  Aftercare following surgery for neoplasm  Muscle weakness (generalized)  Stiffness of left shoulder, not elsewhere classified  Ductal carcinoma in situ of left breast     Problem List Patient Active Problem List   Diagnosis Date Noted   Mass of skin of back 01/20/2021   Breast cancer ([place]) 11/05/2016   Ductal carcinoma in situ of left breast 10/09/2016   Anemia 05/22/2011    Otelia Limes, PTA 03/13/2021, 6:11 PM  [facility] @ [place], [place], [address] Phone: [phone]   Fax:   [phone]  Name: NYCHELLE CASSATA MRN: [id number] Date of Birth: 1961-04-04

## 2021-03-15 ENCOUNTER — Ambulatory Visit: Payer: 59

## 2021-03-15 ENCOUNTER — Other Ambulatory Visit: Payer: Self-pay

## 2021-03-15 DIAGNOSIS — D0512 Intraductal carcinoma in situ of left breast: Secondary | ICD-10-CM

## 2021-03-15 DIAGNOSIS — R293 Abnormal posture: Secondary | ICD-10-CM

## 2021-03-15 DIAGNOSIS — Z483 Aftercare following surgery for neoplasm: Secondary | ICD-10-CM

## 2021-03-15 DIAGNOSIS — M25612 Stiffness of left shoulder, not elsewhere classified: Secondary | ICD-10-CM

## 2021-03-15 DIAGNOSIS — M6281 Muscle weakness (generalized): Secondary | ICD-10-CM

## 2021-03-15 NOTE — Patient Instructions (Signed)
Access Code: [email or web address] URL: [email or web address] Date: 03/15/2021 Prepared by: Cheral Almas  Exercises Scapular Retraction with Resistance - 1 x daily - 3 x weekly - 1 sets - 10 reps Shoulder External Rotation and Scapular Retraction with Resistance - 1 x daily - 3 x weekly - 1 sets - 10 reps Standing Shoulder Horizontal Abduction with Resistance - 1 x daily - 3 x weekly - 1 sets - 10 reps

## 2021-03-15 NOTE — Therapy (Addendum)
[facility] @ [place], [place], [address] Phone: [phone]   Fax:  [phone]  Physical Therapy Treatment  Patient Details  Name: Brandi Goodman MRN: [id number] Date of Birth: 11-Jul-1960 Referring Provider (PT): Dr. Marla Roe   Encounter Date: 03/15/2021   PT End of Session - 03/15/21 1612     Visit Number 3    Number of Visits 12    Date for PT Re-Evaluation 03/27/21    PT Start Time 1605    PT Stop Time 1659    PT Time Calculation (min) 54 min    Activity Tolerance Patient tolerated treatment well    Behavior During Therapy [facility] for tasks assessed/performed             Past Medical History:  Diagnosis Date   Anxiety    just lately   Dental crown present    Family history of adverse reaction to anesthesia    pt's father has hx. of post-op N/V   History of breast cancer    Hypothyroidism     Past Surgical History:  Procedure Laterality Date   BREAST EXCISIONAL BIOPSY Left 07/06/2004   BREAST RECONSTRUCTION WITH PLACEMENT OF TISSUE EXPANDER AND FLEX HD (ACELLULAR HYDRATED DERMIS) Left 11/05/2016   Procedure: IMMEDIATE BREAST RECONSTRUCTION WITH PLACEMENT OF TISSUE EXPANDER AND LATISSIMUS FLAP;  Surgeon: [name], DO;  Location: [facility];  Service: Plastics;  Laterality: Left;   CATARACT EXTRACTION W/ INTRAOCULAR LENS IMPLANT Right    HYSTEROSCOPY WITH D & C  02/13/2007; 04/09/2008   LAPAROSCOPIC OOPHERECTOMY     left ovary around 2008   LAPAROSCOPIC SALPINGOOPHERECTOMY Left 05/07/2008   LIPOSUCTION Bilateral 02/21/2017   Procedure: LIPOSUCTION;  Surgeon: [name], DO;  Location: [facility];  Service: Plastics;  Laterality: Bilateral;   MASTECTOMY W/ SENTINEL NODE BIOPSY Left 11/05/2016   Procedure: LEFT MASTECTOMY WITH SENTINEL LYMPH NODE BIOPSY;  Surgeon: [name], MD;  Location: [facility];  Service: General;  Laterality: Left;   MASTECTOMY, PARTIAL  Left 05/01/2004   MASTOPEXY Right 02/21/2017   Procedure: MASTOPEXY;  Surgeon: [name], DO;  Location: [facility];  Service: Plastics;  Laterality: Right;   RE-EXCISION OF BREAST LUMPECTOMY Left 05/17/2004   REMOVAL OF BILATERAL TISSUE EXPANDERS WITH PLACEMENT OF BILATERAL BREAST IMPLANTS Left 02/21/2017   Procedure: REMOVAL OF LEFT  TISSUE EXPANDERS WITH PLACEMENT OF BREAST IMPLANT;  Surgeon: [name], DO;  Location: [facility];  Service: Plastics;  Laterality: Left;    There were no vitals filed for this visit.   Subjective Assessment - 03/15/21 1606     Subjective My back has been feeling pretty good but it usually hurts on the weekendsmainly.  I was a little sore the next day, but I felt looser after she worked on me.    Pertinent History Pt had a left lumpectoy on 05/01/2004 with reexcision on 05/17/2004.  She did not have chemo at the time, but did have radiation. On 11/05/2016 she had a mastectomy with SLNB with immediate reconstruction and lat flap secondary to reoccurence. She did not have PT after that. It hurts more with lifting and reaching.    How long can you walk comfortably? walking and shopping left upper back fatigues. Can't shop as long    Diagnostic tests [place] chest/ and back    Patient Stated Goals Be able to do normal activities without increased pain  Currently in Pain? No/denies    Pain Score 0-No pain    Multiple Pain Sites Yes                               [facility] Adult PT Treatment/Exercise - 03/15/21 0001       Shoulder Exercises: Supine   Horizontal ABduction Strengthening;Both;10 reps    Theraband Level (Shoulder Horizontal ABduction) Level 1 (Yellow)    External Rotation Strengthening;Both;10 reps    Theraband Level (Shoulder External Rotation) Level 1 (Yellow)    Other Supine Exercises rhythmic stabs at 90 deg flexion x 30 sec      Shoulder Exercises: Standing   External Rotation  Strengthening;Both;10 reps    Theraband Level (Shoulder External Rotation) Level 1 (Yellow)    Retraction Strengthening;10 reps    Theraband Level (Shoulder Retraction) Level 1 (Yellow)    Other Standing Exercises single arm chest stretch x 3 B            manual therapy;Soft tissue mobilization to left pectorals, lats, scapular area, and are of scar in supine and SL. PROM to left shoulder to restore functional mobility.              PT Long Term Goals - 02/13/21 1755       PT LONG TERM GOAL #1   Title Pt will be independent and compliant with HEP for shoulder ROM, strength, stability and posture    Time 4    Period Weeks    Status New    Target Date 03/13/21      PT LONG TERM GOAL #2   Title Pt will have decreased left thoracic pain by atleast 50% for improved ability to perform home activities    Time 6    Period Weeks    Status New    Target Date 03/27/21      PT LONG TERM GOAL #3   Title Pts left UE will be improved to 4+/5 throughout in available ROM    Time 6    Period Weeks    Status New    Target Date 03/27/21      PT LONG TERM GOAL #4   Title Pts quick dash will be improved to no greater than 10%    Baseline 20%    Time 6    Period Weeks    Status New    Target Date 03/27/21                   Plan - 03/15/21 1659     Clinical Impression Statement Continued soft tissue mobilization to pecs, lats, and scapular area with tightness/tenderness noted.  Performed PROM to left shoulder with VC's to pt to relax.  Progressed to chest stretching at wall and scapular strength/stability exercises.  Updated HEP and gave pt yellow theraband    Personal Factors and Comorbidities Comorbidity 2    Comorbidities left breast mastectomy with reconstruction and lat flap, increased thoracic kyphosis/scoliosis    Examination-Activity Limitations Reach Overhead;Lift    Stability/Clinical Decision Making Stable/Uncomplicated    Rehab Potential Good    PT  Frequency 2x / week    PT Duration 6 weeks    PT Treatment/Interventions ADLs/Self Care Home Management;Therapeutic activities;Therapeutic exercise;Neuromuscular re-education;Manual techniques;Patient/family education;Scar mobilization;Passive range of motion    PT Next Visit Plan review TB exs instructed last visit, pulleys, cont mSTM prn and PROM, scapular stab exs    PT  Home Exercise Plan standing TB Scap retraction, bilateral ER, supine horizontal abd all with yellow, single arm chest stretch    Consulted and Agree with Plan of Care Patient             Patient will benefit from skilled therapeutic intervention in order to improve the following deficits and impairments:  Decreased activity tolerance, Decreased mobility, Decreased strength, Postural dysfunction, Decreased scar mobility, Decreased knowledge of precautions, Pain, Impaired UE functional use, Increased muscle spasms, Decreased range of motion  Visit Diagnosis: Abnormal posture  Aftercare following surgery for neoplasm  Muscle weakness (generalized)  Stiffness of left shoulder, not elsewhere classified  Ductal carcinoma in situ of left breast     Problem List Patient Active Problem List   Diagnosis Date Noted   Mass of skin of back 01/20/2021   Breast cancer ([place]) 11/05/2016   Ductal carcinoma in situ of left breast 10/09/2016   Anemia 05/22/2011    Claris Pong, PT 03/15/2021, 5:07 PM  [facility] @ [place], [place], [address] Phone: [phone]   Fax:  [phone]  Name: BETHEL SIROIS MRN: [id number] Date of Birth: [birth year]

## 2021-03-20 ENCOUNTER — Other Ambulatory Visit: Payer: Self-pay

## 2021-03-20 ENCOUNTER — Ambulatory Visit: Payer: 59

## 2021-03-20 DIAGNOSIS — M6281 Muscle weakness (generalized): Secondary | ICD-10-CM

## 2021-03-20 DIAGNOSIS — M25612 Stiffness of left shoulder, not elsewhere classified: Secondary | ICD-10-CM

## 2021-03-20 DIAGNOSIS — Z483 Aftercare following surgery for neoplasm: Secondary | ICD-10-CM

## 2021-03-20 DIAGNOSIS — D0512 Intraductal carcinoma in situ of left breast: Secondary | ICD-10-CM

## 2021-03-20 DIAGNOSIS — R293 Abnormal posture: Secondary | ICD-10-CM | POA: Diagnosis not present

## 2021-03-20 NOTE — Therapy (Signed)
[facility] @ [place], [place], [address] Phone: [phone]   Fax:  [phone]  Physical Therapy Treatment  Patient Details  Name: Brandi Goodman MRN: [id number] Date of Birth: 1960-10-25 Referring Provider (PT): Dr. Marla Roe   Encounter Date: 03/20/2021   PT End of Session - 03/20/21 1643     Visit Number 4    Number of Visits 12    Date for PT Re-Evaluation 03/27/21    PT Start Time 1603    PT Stop Time 1658    PT Time Calculation (min) 55 min    Activity Tolerance Patient tolerated treatment well    Behavior During Therapy [facility] for tasks assessed/performed             Past Medical History:  Diagnosis Date   Anxiety    just lately   Dental crown present    Family history of adverse reaction to anesthesia    pt's father has hx. of post-op N/V   History of breast cancer    Hypothyroidism     Past Surgical History:  Procedure Laterality Date   BREAST EXCISIONAL BIOPSY Left 07/06/2004   BREAST RECONSTRUCTION WITH PLACEMENT OF TISSUE EXPANDER AND FLEX HD (ACELLULAR HYDRATED DERMIS) Left 11/05/2016   Procedure: IMMEDIATE BREAST RECONSTRUCTION WITH PLACEMENT OF TISSUE EXPANDER AND LATISSIMUS FLAP;  Surgeon: [name], DO;  Location: [facility];  Service: Plastics;  Laterality: Left;   CATARACT EXTRACTION W/ INTRAOCULAR LENS IMPLANT Right    HYSTEROSCOPY WITH D & C  02/13/2007; 04/09/2008   LAPAROSCOPIC OOPHERECTOMY     left ovary around 2008   LAPAROSCOPIC SALPINGOOPHERECTOMY Left 05/07/2008   LIPOSUCTION Bilateral 02/21/2017   Procedure: LIPOSUCTION;  Surgeon: [name], DO;  Location: [facility];  Service: Plastics;  Laterality: Bilateral;   MASTECTOMY W/ SENTINEL NODE BIOPSY Left 11/05/2016   Procedure: LEFT MASTECTOMY WITH SENTINEL LYMPH NODE BIOPSY;  Surgeon: [name], MD;  Location: [facility];  Service: General;  Laterality: Left;   MASTECTOMY, PARTIAL  Left 05/01/2004   MASTOPEXY Right 02/21/2017   Procedure: MASTOPEXY;  Surgeon: [name], DO;  Location: [facility];  Service: Plastics;  Laterality: Right;   RE-EXCISION OF BREAST LUMPECTOMY Left 05/17/2004   REMOVAL OF BILATERAL TISSUE EXPANDERS WITH PLACEMENT OF BILATERAL BREAST IMPLANTS Left 02/21/2017   Procedure: REMOVAL OF LEFT  TISSUE EXPANDERS WITH PLACEMENT OF BREAST IMPLANT;  Surgeon: [name], DO;  Location: [facility];  Service: Plastics;  Laterality: Left;    There were no vitals filed for this visit.   Subjective Assessment - 03/20/21 1603     Subjective I could tell we had done something after I was here but it wasn't painful. I woke up Friday night with my neck hurting but when I woke up Saturday it was fine.  I think I slept on it funny. I didn't notice the spasm like I had been having on the weekends, but my husband did the vacuuming.    Pertinent History Pt had a left lumpectomy on 05/01/2004 with reexcision on 05/17/2004.  She did not have chemo at the time, but did have radiation. On 11/05/2016 she had a mastectomy with SLNB with immediate reconstruction and lat flap secondary to reoccurence. She did not have PT after that. It hurts more with lifting and reaching.    How long can you sit comfortably? unlimited    How long can you  walk comfortably? walking and shopping left upper back fatigues. Can't shop as long    Diagnostic tests [place] chest/ and back    Patient Stated Goals Be able to do normal activities without increased pain    Currently in Pain? No/denies    Pain Score 0-No pain                               [facility] Adult PT Treatment/Exercise - 03/20/21 0001       Shoulder Exercises: Supine   Horizontal ABduction Strengthening;Both;12 reps    Theraband Level (Shoulder Horizontal ABduction) Level 1 (Yellow)    Other Supine Exercises rhythmic stabs at 90 and 120 deg flexion x 30 sec    Other  Supine Exercises supine alphabet 1# A-Z      Shoulder Exercises: Standing   External Rotation Strengthening;Both;12 reps    Theraband Level (Shoulder External Rotation) Level 1 (Yellow)    Retraction Strengthening;Both;12 reps    Theraband Level (Shoulder Retraction) Level 1 (Yellow)    Other Standing Exercises purple ball stabs x 20 side to side/up and down    Other Standing Exercises standing pec stretch in doorway x 3      Manual Therapy   Soft tissue mobilization In Supine to pectoralis lateral trunk and lateral border of scapula, then in Rt S/L with cocoa butter along lat flap incision and to area of most tightness where trigger point palpable at superior aspect of incision, also briefly to Lt upper trap area    Myofascial Release to left pectoralis tendon/lats during PROM    Passive ROM In Supine to Lt shoulder into flexion, abduction and D2 to pts available end ROM                          PT Long Term Goals - 02/13/21 1755       PT LONG TERM GOAL #1   Title Pt will be independent and compliant with HEP for shoulder ROM, strength, stability and posture    Time 4    Period Weeks    Status New    Target Date 03/13/21      PT LONG TERM GOAL #2   Title Pt will have decreased left thoracic pain by atleast 50% for improved ability to perform home activities    Time 6    Period Weeks    Status New    Target Date 03/27/21      PT LONG TERM GOAL #3   Title Pts left UE will be improved to 4+/5 throughout in available ROM    Time 6    Period Weeks    Status New    Target Date 03/27/21      PT LONG TERM GOAL #4   Title Pts quick dash will be improved to no greater than 10%    Baseline 20%    Time 6    Period Weeks    Status New    Target Date 03/27/21                   Plan - 03/20/21 1643     Clinical Impression Statement Continued soft tissue mobilization in supine and SL with significant tightness noted today in left pectorals.  Continued  PROM to pts shoulder with some MFR to pecs and lats.  Continued stability exs in supine and standing.  Pt was very fatigued  with ball rolls on wall. She demonstrated good form with theraband exercises.    Personal Factors and Comorbidities Comorbidity 2    Comorbidities left breast mastectomy with reconstruction and lat flap, increased thoracic kyphosis/scoliosis    Examination-Activity Limitations Reach Overhead;Lift    Examination-Participation Restrictions Cleaning    Stability/Clinical Decision Making Stable/Uncomplicated    Rehab Potential Good    PT Frequency 2x / week    PT Duration 6 weeks    PT Treatment/Interventions ADLs/Self Care Home Management;Therapeutic activities;Therapeutic exercise;Neuromuscular re-education;Manual techniques;Patient/family education;Scar mobilization;Passive range of motion    PT Next Visit Plan pulleys, cont mSTM prn and PROM, scapular stab exs    PT Home Exercise Plan standing TB Scap retraction, bilateral ER, supine horizontal abd all with yellow, single arm chest stretch    Consulted and Agree with Plan of Care Patient             Patient will benefit from skilled therapeutic intervention in order to improve the following deficits and impairments:  Decreased activity tolerance, Decreased mobility, Decreased strength, Postural dysfunction, Decreased scar mobility, Decreased knowledge of precautions, Pain, Impaired UE functional use, Increased muscle spasms, Decreased range of motion  Visit Diagnosis: Abnormal posture  Aftercare following surgery for neoplasm  Muscle weakness (generalized)  Stiffness of left shoulder, not elsewhere classified  Ductal carcinoma in situ of left breast     Problem List Patient Active Problem List   Diagnosis Date Noted   Mass of skin of back 01/20/2021   Breast cancer ([place]) 11/05/2016   Ductal carcinoma in situ of left breast 10/09/2016   Anemia 05/22/2011    Claris Pong, PT 03/20/2021, 5:06  PM  [facility] @ [place], [place], [address] Phone: [phone]   Fax:  [phone]  Name: Brandi Goodman MRN: [id number] Date of Birth: 10-04-1960

## 2021-03-22 ENCOUNTER — Ambulatory Visit: Payer: 59

## 2021-03-22 ENCOUNTER — Other Ambulatory Visit: Payer: Self-pay

## 2021-03-22 DIAGNOSIS — D0512 Intraductal carcinoma in situ of left breast: Secondary | ICD-10-CM

## 2021-03-22 DIAGNOSIS — Z483 Aftercare following surgery for neoplasm: Secondary | ICD-10-CM

## 2021-03-22 DIAGNOSIS — M6281 Muscle weakness (generalized): Secondary | ICD-10-CM

## 2021-03-22 DIAGNOSIS — M25612 Stiffness of left shoulder, not elsewhere classified: Secondary | ICD-10-CM

## 2021-03-22 DIAGNOSIS — R293 Abnormal posture: Secondary | ICD-10-CM

## 2021-03-22 NOTE — Patient Instructions (Signed)
Access Code: [email or web address] URL: [email or web address] Date: 03/22/2021 Prepared by: Cheral Almas  Program Notes for second exercise also do in V position (45 degree angle with body)   Exercises Scapular Retraction with Resistance Advanced - 1 x daily - 3 x weekly - 1-2 sets - 10 reps Standing Shoulder Flexion to 90 Degrees with Dumbbells - 1 x daily - 3 x weekly - 1 sets - 10 reps
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
## 2021-03-27 ENCOUNTER — Other Ambulatory Visit: Payer: Self-pay

## 2021-03-27 ENCOUNTER — Ambulatory Visit: Payer: 59

## 2021-03-27 DIAGNOSIS — M25612 Stiffness of left shoulder, not elsewhere classified: Secondary | ICD-10-CM

## 2021-03-27 DIAGNOSIS — R293 Abnormal posture: Secondary | ICD-10-CM

## 2021-03-27 DIAGNOSIS — Z483 Aftercare following surgery for neoplasm: Secondary | ICD-10-CM

## 2021-03-27 DIAGNOSIS — D0512 Intraductal carcinoma in situ of left breast: Secondary | ICD-10-CM

## 2021-03-27 DIAGNOSIS — M6281 Muscle weakness (generalized): Secondary | ICD-10-CM

## 2021-03-27 NOTE — Patient Instructions (Signed)
Access Code: [email or web address] URL: [email or web address] Date: 03/27/2021 Prepared by: Cheral Almas  Program Notes use 1# wt or water bottle   Exercises Standing Shoulder Flexion to 90 Degrees with Dumbbells - 1 x daily - 3-4 x weekly - 1 sets - 10 reps Shoulder Scaption at Wall Thumbs Up - 1 x daily - 3-4 x weekly - 1 sets - 10 reps

## 2021-03-27 NOTE — Therapy (Addendum)
[facility] @ [place], [place], [address] Phone: [phone]   Fax:  [phone]  Physical Therapy Treatment  Patient Details  Name: Brandi Goodman MRN: [id number] Date of Birth: 19-Sep-1960 Referring Provider (PT): Dr. Marla Roe   Encounter Date: 03/27/2021   PT End of Session - 03/27/21 1639     Visit Number 6    Number of Visits 14    Date for PT Re-Evaluation 04/24/21    PT Start Time 1601    PT Stop Time 1659    PT Time Calculation (min) 58 min    Activity Tolerance Patient tolerated treatment well    Behavior During Therapy [facility] for tasks assessed/performed             Past Medical History:  Diagnosis Date   Anxiety    just lately   Dental crown present    Family history of adverse reaction to anesthesia    pt's father has hx. of post-op N/V   History of breast cancer    Hypothyroidism     Past Surgical History:  Procedure Laterality Date   BREAST EXCISIONAL BIOPSY Left 07/06/2004   BREAST RECONSTRUCTION WITH PLACEMENT OF TISSUE EXPANDER AND FLEX HD (ACELLULAR HYDRATED DERMIS) Left 11/05/2016   Procedure: IMMEDIATE BREAST RECONSTRUCTION WITH PLACEMENT OF TISSUE EXPANDER AND LATISSIMUS FLAP;  Surgeon: [name], DO;  Location: [place];  Service: Plastics;  Laterality: Left;   CATARACT EXTRACTION W/ INTRAOCULAR LENS IMPLANT Right    HYSTEROSCOPY WITH D & C  02/13/2007; 04/09/2008   LAPAROSCOPIC OOPHERECTOMY     left ovary around 2008   LAPAROSCOPIC SALPINGOOPHERECTOMY Left 05/07/2008   LIPOSUCTION Bilateral 02/21/2017   Procedure: LIPOSUCTION;  Surgeon: [name], DO;  Location: [place];  Service: Plastics;  Laterality: Bilateral;   MASTECTOMY W/ SENTINEL NODE BIOPSY Left 11/05/2016   Procedure: LEFT MASTECTOMY WITH SENTINEL LYMPH NODE BIOPSY;  Surgeon: [name], MD;  Location: [place];  Service: General;  Laterality: Left;   MASTECTOMY, PARTIAL  Left 05/01/2004   MASTOPEXY Right 02/21/2017   Procedure: MASTOPEXY;  Surgeon: [name], DO;  Location: [place];  Service: Plastics;  Laterality: Right;   RE-EXCISION OF BREAST LUMPECTOMY Left 05/17/2004   REMOVAL OF BILATERAL TISSUE EXPANDERS WITH PLACEMENT OF BILATERAL BREAST IMPLANTS Left 02/21/2017   Procedure: REMOVAL OF LEFT  TISSUE EXPANDERS WITH PLACEMENT OF BREAST IMPLANT;  Surgeon: [name], DO;  Location: [place];  Service: Plastics;  Laterality: Left;    There were no vitals filed for this visit.   Subjective Assessment - 03/27/21 1555     Subjective Felt really good after last visit, and this weekend I was very active and I was worried. Only 1 time did I get tired and sit down for a few minutes.  Did alot of cooking, folding clothes but fatigue not pain.    Pertinent History Pt had a left lumpectoy on 05/01/2004 with reexcision on 05/17/2004.  She did not have chemo at the time, but did have radiation. On 11/05/2016 she had a mastectomy with SLNB with immediate reconstruction and lat flap secondary to reoccurence. She did not have PT after that. It hurts more with lifting and reaching.    How long can you sit comfortably? unlimited    How long can you walk comfortably? walking and shopping left upper back fatigues. Can't shop as long    Diagnostic tests  [place] chest/ and back    Patient Stated Goals Be able to do normal activities without increased pain    Currently in Pain? No/denies    Pain Score 0-No pain                [facility] PT Assessment - 03/27/21 0001       Assessment   Medical Diagnosis Left breast CA with Lat Flap reconstruction/mid back pain    Referring Provider (PT) Dr. Marla Roe    Onset Date/Surgical Date 11/05/16    Hand Dominance Left    Prior Therapy no      Precautions   Precaution Comments lymphedema risk      Prior Function   Level of Independence Independent      AROM   Left Shoulder  Flexion 142 Degrees    Left Shoulder ABduction 157 Degrees      Strength   Right Shoulder Flexion 4+/5    Right Shoulder ABduction 4+/5    Right Shoulder Internal Rotation 5/5    Right Shoulder External Rotation 4+/5    Left Shoulder Flexion 4/5    Left Shoulder ABduction 4/5    Left Shoulder Internal Rotation 4+/5    Left Shoulder External Rotation 4+/5                   Quick Dash - 03/27/21 0001     Open a tight or new jar No difficulty    Do heavy household chores (wash walls, wash floors) Mild difficulty    Carry a shopping bag or briefcase No difficulty    Wash your back No difficulty    Use a knife to cut food No difficulty    Recreational activities in which you take some force or impact through your arm, shoulder, or hand (golf, hammering, tennis) Mild difficulty    During the past week, to what extent has your arm, shoulder or hand problem interfered with your normal social activities with family, friends, neighbors, or groups? Not at all    During the past week, to what extent has your arm, shoulder or hand problem limited your work or other regular daily activities Not at all    Arm, shoulder, or hand pain. None    Tingling (pins and needles) in your arm, shoulder, or hand None    Difficulty Sleeping No difficulty    DASH Score 4.55 %                    [facility] Adult PT Treatment/Exercise - 03/27/21 0001       Shoulder Exercises: Supine   Other Supine Exercises Supine wand flex and scaption x 4    Other Supine Exercises Supine AROM flex, scaption, horizontal abd x5 B      Shoulder Exercises: Standing   Other Standing Exercises counter walks 4 steps each direction x 3 with yellow TB    Other Standing Exercises jobes flexion and scaption 1# x 10      Manual Therapy   Manual Therapy Soft tissue mobilization;Myofascial release;Passive ROM    Soft tissue mobilization In Supine to pectoralis lateral trunk and lateral border of scapula, then in Rt S/L  with cocoa butter along lat flap incision and  also  to Lt upper/levator trap area    Myofascial Release to left pectoralis tendon during PROM    Passive ROM In Supine to Lt shoulder into flexion, abduction and D2 to pts available end ROM  PT Education - 03/27/21 1700     Education Details Gave pt pictures of jobes flexion and scaption    Person(s) Educated Patient    Methods Handout;Explanation    Comprehension Returned demonstration                 PT Long Term Goals - 03/27/21 1605       PT LONG TERM GOAL #1   Title Pt will be independent and compliant with HEP for shoulder ROM, strength, stability and posture    Time 4    Period Weeks    Status Achieved    Target Date 03/27/21      PT LONG TERM GOAL #2   Title Pt will have decreased left thoracic pain by atleast 50% for improved ability to perform home activities    Baseline 40%    Time 6    Period Weeks    Status On-going    Target Date 04/24/21      PT LONG TERM GOAL #3   Title Pts left UE will be improved to 4+/5 throughout in available ROM    Baseline improved significantly but not fully achieved    Time 6    Period Weeks    Status On-going    Target Date 04/24/21      PT LONG TERM GOAL #4   Title Pts quick dash will be improved to no greater than 10%    Baseline 20%    Time 6    Period Weeks    Status Achieved    Target Date 03/27/21                   Plan - 03/27/21 1640     Clinical Impression Statement Pt has made excellent improvements in shoulder ROM and has made good progress toward goals.  She has achieved quick dash goal and independence with HEP.  She has had a noticeable decrease in pain, and increase in bilateral shoulder strength however, deficits remain.  She will continue to benefit from skilled therapy to address remaining deficits to return to PLOF    Personal Factors and Comorbidities Comorbidity 2    Comorbidities left breast mastectomy  with reconstruction and lat flap, increased thoracic kyphosis/scoliosis    Examination-Activity Limitations Reach Overhead;Lift    Stability/Clinical Decision Making Stable/Uncomplicated    Rehab Potential Good    PT Frequency 2x / week    PT Duration 4 weeks    PT Treatment/Interventions ADLs/Self Care Home Management;Therapeutic activities;Therapeutic exercise;Neuromuscular re-education;Manual techniques;Patient/family education;Scar mobilization;Passive range of motion    PT Next Visit Plan cont mSTM prn and PROM, scapular stab exs, shoulder strength, wall walks with band?             Patient will benefit from skilled therapeutic intervention in order to improve the following deficits and impairments:  Decreased activity tolerance, Decreased mobility, Decreased strength, Postural dysfunction, Decreased scar mobility, Decreased knowledge of precautions, Pain, Impaired UE functional use, Increased muscle spasms, Decreased range of motion  Visit Diagnosis: Abnormal posture  Aftercare following surgery for neoplasm  Muscle weakness (generalized)  Stiffness of left shoulder, not elsewhere classified  Ductal carcinoma in situ of left breast     Problem List Patient Active Problem List   Diagnosis Date Noted   Mass of skin of back 01/20/2021   Breast cancer ([facility]) 11/05/2016   Ductal carcinoma in situ of left breast 10/09/2016   Anemia 05/22/2011    Claris Pong, PT 03/27/2021, 5:06  PM  [facility] @ [place], [place], [address] Phone: [phone]   Fax:  [phone]  Name: Brandi Goodman MRN: [id number] Date of Birth: 1961/03/24

## 2021-03-28 ENCOUNTER — Telehealth: Payer: 59 | Admitting: Plastic Surgery

## 2021-03-28 ENCOUNTER — Ambulatory Visit (INDEPENDENT_AMBULATORY_CARE_PROVIDER_SITE_OTHER): Payer: 59 | Admitting: Plastic Surgery

## 2021-03-28 DIAGNOSIS — C50912 Malignant neoplasm of unspecified site of left female breast: Secondary | ICD-10-CM

## 2021-03-28 DIAGNOSIS — D0512 Intraductal carcinoma in situ of left breast: Secondary | ICD-10-CM | POA: Diagnosis not present

## 2021-03-28 NOTE — Progress Notes (Addendum)
   Subjective:    Patient ID: Brandi Goodman, female    DOB: 09/11/1960, 60 y.o.   MRN: [id number]  The patient is a 60 year old female joining me by phone.  She underwent left breast reconstruction for cancer and a right breast mastopexy for symmetry in 2018.  At her last visit she had some concerns about stiffness and lack of range of motion in her arm.  She has started physical therapy and is doing much better.  She is very happy with her progress.  She does not want to do anything right now to change on her breasts.  She would like to give it some time.  I think that is a great idea.  There was area that she was concerned about and we did some radiography which showed a cyst and that not creating any more concern for her.    Review of Systems  Constitutional: Negative.   Eyes: Negative.   Respiratory: Negative.    Cardiovascular: Negative.   Gastrointestinal: Negative.   Endocrine: Negative.   Genitourinary: Negative.   Musculoskeletal: Negative.   Psychiatric/Behavioral: Negative.         Objective:   Physical Exam     Assessment & Plan:     ICD-10-CM   1. Ductal carcinoma in situ of left breast  D05.12     2. Malignant neoplasm of left female breast, unspecified estrogen receptor status, unspecified site of breast ([place])  C50.912         I connected with  Brandi Goodman on 01/23/22 by a phone and verified that I am speaking with the correct person using two identifiers.  I spent approximately 10 minutes in the following manner: Review of chart, review of information, discussion with the patient on how she is doing and documentation in the chart   I discussed the limitations of evaluation and management by telemedicine. The patient expressed understanding and agreed to proceed.  Plan for follow-up in 1 year.

## 2021-03-29 ENCOUNTER — Other Ambulatory Visit: Payer: Self-pay

## 2021-03-29 ENCOUNTER — Ambulatory Visit: Payer: 59

## 2021-03-29 DIAGNOSIS — R293 Abnormal posture: Secondary | ICD-10-CM

## 2021-03-29 DIAGNOSIS — M25612 Stiffness of left shoulder, not elsewhere classified: Secondary | ICD-10-CM

## 2021-03-29 DIAGNOSIS — Z483 Aftercare following surgery for neoplasm: Secondary | ICD-10-CM

## 2021-03-29 DIAGNOSIS — M6281 Muscle weakness (generalized): Secondary | ICD-10-CM

## 2021-03-29 DIAGNOSIS — D0512 Intraductal carcinoma in situ of left breast: Secondary | ICD-10-CM

## 2021-03-29 NOTE — Therapy (Addendum)
[facility] @ [place], [place], [address] Phone: [phone]   Fax:  [phone]  Physical Therapy Treatment  Patient Details  Name: Brandi Goodman MRN: [id number] Date of Birth: 10-09-1960 Referring Provider (PT): Dr. Marla Roe   Encounter Date: 03/29/2021   PT End of Session - 03/29/21 1634     Visit Number 7    Number of Visits 14    Date for PT Re-Evaluation 04/24/21    PT Start Time 1600    PT Stop Time 1649    PT Time Calculation (min) 49 min    Activity Tolerance Patient tolerated treatment well    Behavior During Therapy [facility] for tasks assessed/performed             Past Medical History:  Diagnosis Date   Anxiety    just lately   Dental crown present    Family history of adverse reaction to anesthesia    pt's father has hx. of post-op N/V   History of breast cancer    Hypothyroidism     Past Surgical History:  Procedure Laterality Date   BREAST EXCISIONAL BIOPSY Left 07/06/2004   BREAST RECONSTRUCTION WITH PLACEMENT OF TISSUE EXPANDER AND FLEX HD (ACELLULAR HYDRATED DERMIS) Left 11/05/2016   Procedure: IMMEDIATE BREAST RECONSTRUCTION WITH PLACEMENT OF TISSUE EXPANDER AND LATISSIMUS FLAP;  Surgeon: [name], DO;  Location: [facility];  Service: Plastics;  Laterality: Left;   CATARACT EXTRACTION W/ INTRAOCULAR LENS IMPLANT Right    HYSTEROSCOPY WITH D & C  02/13/2007; 04/09/2008   LAPAROSCOPIC OOPHERECTOMY     left ovary around 2008   LAPAROSCOPIC SALPINGOOPHERECTOMY Left 05/07/2008   LIPOSUCTION Bilateral 02/21/2017   Procedure: LIPOSUCTION;  Surgeon: [name], DO;  Location: [facility];  Service: Plastics;  Laterality: Bilateral;   MASTECTOMY W/ SENTINEL NODE BIOPSY Left 11/05/2016   Procedure: LEFT MASTECTOMY WITH SENTINEL LYMPH NODE BIOPSY;  Surgeon: [name], MD;  Location: [place];  Service: General;  Laterality: Left;   MASTECTOMY, PARTIAL Left  05/01/2004   MASTOPEXY Right 02/21/2017   Procedure: MASTOPEXY;  Surgeon: [name], DO;  Location: [place];  Service: Plastics;  Laterality: Right;   RE-EXCISION OF BREAST LUMPECTOMY Left 05/17/2004   REMOVAL OF BILATERAL TISSUE EXPANDERS WITH PLACEMENT OF BILATERAL BREAST IMPLANTS Left 02/21/2017   Procedure: REMOVAL OF LEFT  TISSUE EXPANDERS WITH PLACEMENT OF BREAST IMPLANT;  Surgeon: [name], DO;  Location: [place];  Service: Plastics;  Laterality: Left;    There were no vitals filed for this visit.   Subjective Assessment - 03/29/21 1602     Subjective My back/ shoulder is feeling really good.  I have been doing alittle exercise each day.  No pain since I was here.  I was able to fold clothes last night without pain.    Pertinent History Pt had a left lumpectoy on 05/01/2004 with reexcision on 05/17/2004.  She did not have chemo at the time, but did have radiation. On 11/05/2016 she had a mastectomy with SLNB with immediate reconstruction and lat flap secondary to reoccurence. She did not have PT after that. It hurts more with lifting and reaching.    How long can you sit comfortably? unlimited    How long can you walk comfortably? walking and shopping left upper back fatigues. Can't shop as long    Diagnostic tests [place] chest/ and back    Patient Stated  Goals Be able to do normal activities without increased pain    Currently in Pain? No/denies    Pain Score 0-No pain                               [facility] Adult PT Treatment/Exercise - 03/29/21 0001       Shoulder Exercises: Standing   Protraction Strengthening;Both;10 reps    Theraband Level (Shoulder Protraction) Level 2 (Red)    External Rotation Strengthening;Both;10 reps    Theraband Level (Shoulder External Rotation) Level 2 (Red)    Extension Strengthening;Both;10 reps    Theraband Level (Shoulder Extension) Level 2 (Red)    Retraction  Strengthening;Both;10 reps    Theraband Level (Shoulder Retraction) Level 2 (Red)    Shoulder Elevation --   purple ball alphabet x 1   Shoulder Elevation Limitations wall lift offs 2x5    Other Standing Exercises counter walks 4 steps each direction x 3 with yellow TB    Other Standing Exercises jobes flexion and scaption 1# x 12      Manual Therapy   Soft tissue mobilization In Supine to pectoralis lateral trunk and lateral border of scapula, then in Rt S/L with cocoa butter along lat flap incision and  also  to Lt upper/levator trap area    Myofascial Release to left pectoralis tendon during PROM    Passive ROM In Supine to Lt shoulder into flexion, abduction and D2 to pts available end ROM                          PT Long Term Goals - 03/27/21 1605       PT LONG TERM GOAL #1   Title Pt will be independent and compliant with HEP for shoulder ROM, strength, stability and posture    Time 4    Period Weeks    Status Achieved    Target Date 03/27/21      PT LONG TERM GOAL #2   Title Pt will have decreased left thoracic pain by atleast 50% for improved ability to perform home activities    Baseline 40%    Time 6    Period Weeks    Status On-going    Target Date 04/24/21      PT LONG TERM GOAL #3   Title Pts left UE will be improved to 4+/5 throughout in available ROM    Baseline improved significantly but not fully achieved    Time 6    Period Weeks    Status On-going    Target Date 04/24/21      PT LONG TERM GOAL #4   Title Pts quick dash will be improved to no greater than 10%    Baseline 20%    Time 6    Period Weeks    Status Achieved    Target Date 03/27/21                   Plan - 03/29/21 1637     Clinical Impression Statement Pt continues to make good progress and has very little pain.  Still tight in pectorals but with decreased tenderness.  good tolerance for progression of theraband to red. Very compliant with HEP    Personal  Factors and Comorbidities Comorbidity 2    Comorbidities left breast mastectomy with reconstruction and lat flap, increased thoracic kyphosis/scoliosis    Examination-Activity Limitations Reach Overhead;Lift  Examination-Participation Restrictions Cleaning    Stability/Clinical Decision Making Stable/Uncomplicated    Rehab Potential Good    PT Frequency 2x / week    PT Duration 4 weeks    PT Treatment/Interventions ADLs/Self Care Home Management;Therapeutic activities;Therapeutic exercise;Neuromuscular re-education;Manual techniques;Patient/family education;Scar mobilization;Passive range of motion    PT Next Visit Plan cont mSTM prn and PROM, scapular stab exs, shoulder strength, wall walks with band?    PT Home Exercise Plan standing TB Scap retraction, bilateral ER, supine horizontal abd, ext all with yellow, single arm chest stretch, jobes flex and scaption    Consulted and Agree with Plan of Care Patient             Patient will benefit from skilled therapeutic intervention in order to improve the following deficits and impairments:  Decreased activity tolerance, Decreased mobility, Decreased strength, Postural dysfunction, Decreased scar mobility, Decreased knowledge of precautions, Pain, Impaired UE functional use, Increased muscle spasms, Decreased range of motion  Visit Diagnosis: Muscle weakness (generalized)  Stiffness of left shoulder, not elsewhere classified  Abnormal posture  Aftercare following surgery for neoplasm  Ductal carcinoma in situ of left breast     Problem List Patient Active Problem List   Diagnosis Date Noted   Mass of skin of back 01/20/2021   Breast cancer ([place]) 11/05/2016   Ductal carcinoma in situ of left breast 10/09/2016   Anemia 05/22/2011    Claris Pong, PT 03/29/2021, 4:52 PM  [facility] @ [place], [place], [address] Phone: [phone]   Fax:   [phone]  Name: PERCILLA TWETEN MRN: [id number] Date of Birth: 1960/12/07

## 2021-04-03 ENCOUNTER — Ambulatory Visit: Payer: 59

## 2021-04-03 ENCOUNTER — Other Ambulatory Visit: Payer: Self-pay

## 2021-04-03 DIAGNOSIS — D0512 Intraductal carcinoma in situ of left breast: Secondary | ICD-10-CM

## 2021-04-03 DIAGNOSIS — M6281 Muscle weakness (generalized): Secondary | ICD-10-CM

## 2021-04-03 DIAGNOSIS — R293 Abnormal posture: Secondary | ICD-10-CM | POA: Diagnosis not present

## 2021-04-03 DIAGNOSIS — Z483 Aftercare following surgery for neoplasm: Secondary | ICD-10-CM

## 2021-04-03 DIAGNOSIS — M25612 Stiffness of left shoulder, not elsewhere classified: Secondary | ICD-10-CM

## 2021-04-03 NOTE — Therapy (Signed)
[facility] @ [place], [place], [address] Phone: [phone]   Fax:  [phone]  Physical Therapy Treatment  Patient Details  Name: Brandi Goodman MRN: [id number] Date of Birth: [birth year] Referring Provider (PT): Dr. Marla Roe   Encounter Date: 04/03/2021   PT End of Session - 04/03/21 1634     Visit Number 8    Number of Visits 14    Date for PT Re-Evaluation 04/24/21    PT Start Time 1606    PT Stop Time 1653    PT Time Calculation (min) 47 min    Activity Tolerance Patient tolerated treatment well    Behavior During Therapy [facility] for tasks assessed/performed             Past Medical History:  Diagnosis Date   Anxiety    just lately   Dental crown present    Family history of adverse reaction to anesthesia    pt's father has hx. of post-op N/V   History of breast cancer    Hypothyroidism     Past Surgical History:  Procedure Laterality Date   BREAST EXCISIONAL BIOPSY Left 07/06/2004   BREAST RECONSTRUCTION WITH PLACEMENT OF TISSUE EXPANDER AND FLEX HD (ACELLULAR HYDRATED DERMIS) Left 11/05/2016   Procedure: IMMEDIATE BREAST RECONSTRUCTION WITH PLACEMENT OF TISSUE EXPANDER AND LATISSIMUS FLAP;  Surgeon: [name], DO;  Location: [place];  Service: Plastics;  Laterality: Left;   CATARACT EXTRACTION W/ INTRAOCULAR LENS IMPLANT Right    HYSTEROSCOPY WITH D & C  02/13/2007; 04/09/2008   LAPAROSCOPIC OOPHERECTOMY     left ovary around 2008   LAPAROSCOPIC SALPINGOOPHERECTOMY Left 05/07/2008   LIPOSUCTION Bilateral 02/21/2017   Procedure: LIPOSUCTION;  Surgeon: [name], DO;  Location: [facility];  Service: Plastics;  Laterality: Bilateral;   MASTECTOMY W/ SENTINEL NODE BIOPSY Left 11/05/2016   Procedure: LEFT MASTECTOMY WITH SENTINEL LYMPH NODE BIOPSY;  Surgeon: [name], MD;  Location: [facility];  Service: General;  Laterality: Left;   MASTECTOMY, PARTIAL Left  05/01/2004   MASTOPEXY Right 02/21/2017   Procedure: MASTOPEXY;  Surgeon: [name], DO;  Location: [facility];  Service: Plastics;  Laterality: Right;   RE-EXCISION OF BREAST LUMPECTOMY Left 05/17/2004   REMOVAL OF BILATERAL TISSUE EXPANDERS WITH PLACEMENT OF BILATERAL BREAST IMPLANTS Left 02/21/2017   Procedure: REMOVAL OF LEFT  TISSUE EXPANDERS WITH PLACEMENT OF BREAST IMPLANT;  Surgeon: [name], DO;  Location: [place];  Service: Plastics;  Laterality: Left;    There were no vitals filed for this visit.   Subjective Assessment - 04/03/21 1608     Subjective I layed 4 cement blocks on the ground and did some raking. It didn't bother me when I did it. When I first got up the next day I felt my back a little, but once I warmed up I felt fine. I had an active weekend and I did really well.    Pertinent History Pt had a left lumpectoy on 05/01/2004 with reexcision on 05/17/2004.  She did not have chemo at the time, but did have radiation. On 11/05/2016 she had a mastectomy with SLNB with immediate reconstruction and lat flap secondary to reoccurence. She did not have PT after that. It hurts more with lifting and reaching.    How long can you sit comfortably? unlimited    How long can you walk comfortably? walking and shopping left upper back fatigues.  Can't shop as long    Diagnostic tests [place] chest/ and back    Patient Stated Goals Be able to do normal activities without increased pain    Currently in Pain? No/denies    Pain Score 0-No pain                               [facility] Adult PT Treatment/Exercise - 04/03/21 0001       Shoulder Exercises: Supine   Protraction Strengthening;Left;10 reps    Protraction Weight (lbs) 2    Diagonals Strengthening;Right;Left;10 reps    Theraband Level (Shoulder Diagonals) Level 1 (Yellow)    Diagonals Limitations wall walks 2 laps 3 steps on wall.    Other Supine Exercises chest  press left 2#, 2 x 10   Other Supine Exercises supine alphabet 2# A-Z      Shoulder Exercises: Standing   Other Standing Exercises purple ball stabs x 25 side to side/up and down    Other Standing Exercises jobes flexion and scaption 2# x 10 ea     Manual Therapy   Manual Therapy Soft tissue mobilization;Myofascial release;Passive ROM    Soft tissue mobilization In Supine to pectoralis lateral trunk and lateral border of scapula, then in Rt S/L with cocoa butter along lat flap incision and  also  to Lt upper/levator trap area    Myofascial Release to left pectoralis tendon during PROM    Passive ROM In Supine to Lt shoulder into flexion, abduction and D2 to pts available end ROM                          PT Long Term Goals - 03/27/21 1605       PT LONG TERM GOAL #1   Title Pt will be independent and compliant with HEP for shoulder ROM, strength, stability and posture    Time 4    Period Weeks    Status Achieved    Target Date 03/27/21      PT LONG TERM GOAL #2   Title Pt will have decreased left thoracic pain by atleast 50% for improved ability to perform home activities    Baseline 40%    Time 6    Period Weeks    Status On-going    Target Date 04/24/21      PT LONG TERM GOAL #3   Title Pts left UE will be improved to 4+/5 throughout in available ROM    Baseline improved significantly but not fully achieved    Time 6    Period Weeks    Status On-going    Target Date 04/24/21      PT LONG TERM GOAL #4   Title Pts quick dash will be improved to no greater than 10%    Baseline 20%    Time 6    Period Weeks    Status Achieved    Target Date 03/27/21                   Plan - 04/03/21 1635     Clinical Impression Statement Continued Soft tissue moblization and PROM of the left shoulder and advanced scapular stability/strength.  Pt demonstrates good form with exercises, and has been able to increase resistance without increasing pain. PROM looked  especially good today and was without tightness.  Pt is able to do much more at home without increased pain. Will  decrease to 1x per week if she continues to do well.    Personal Factors and Comorbidities Comorbidity 2    Comorbidities left breast mastectomy with reconstruction and lat flap, increased thoracic kyphosis/scoliosis    Examination-Activity Limitations Reach Overhead;Lift    Examination-Participation Restrictions Cleaning    Stability/Clinical Decision Making Stable/Uncomplicated    Rehab Potential Good    PT Frequency 2x / week    PT Duration 4 weeks    PT Treatment/Interventions ADLs/Self Care Home Management;Therapeutic activities;Therapeutic exercise;Neuromuscular re-education;Manual techniques;Patient/family education;Scar mobilization;Passive range of motion    PT Next Visit Plan cont mSTM prn and PROM, scapular stab exs, shoulder strength, wall walks with band? Decrease to 1x per week.    PT Home Exercise Plan standing TB Scap retraction, bilateral ER, supine horizontal abd, ext all with yellow, single arm chest stretch, jobes flex and scaption    Consulted and Agree with Plan of Care Patient             Patient will benefit from skilled therapeutic intervention in order to improve the following deficits and impairments:  Decreased activity tolerance, Decreased mobility, Decreased strength, Postural dysfunction, Decreased scar mobility, Decreased knowledge of precautions, Pain, Impaired UE functional use, Increased muscle spasms, Decreased range of motion  Visit Diagnosis: Muscle weakness (generalized)  Stiffness of left shoulder, not elsewhere classified  Abnormal posture  Aftercare following surgery for neoplasm  Ductal carcinoma in situ of left breast     Problem List Patient Active Problem List   Diagnosis Date Noted   Mass of skin of back 01/20/2021   Breast cancer ([place]) 11/05/2016   Ductal carcinoma in situ of left breast 10/09/2016   Anemia  05/22/2011    Claris Pong, PT 04/03/2021, 4:58 PM  [facility] @ [place], [place], [address] Phone: [phone]   Fax:  [phone]  Name: Brandi Goodman MRN: [id number] Date of Birth: [birth year]

## 2021-04-05 ENCOUNTER — Other Ambulatory Visit: Payer: Self-pay

## 2021-04-05 ENCOUNTER — Ambulatory Visit: Payer: 59 | Attending: Plastic Surgery

## 2021-04-05 DIAGNOSIS — M25612 Stiffness of left shoulder, not elsewhere classified: Secondary | ICD-10-CM | POA: Diagnosis present

## 2021-04-05 DIAGNOSIS — Z483 Aftercare following surgery for neoplasm: Secondary | ICD-10-CM | POA: Insufficient documentation

## 2021-04-05 DIAGNOSIS — R293 Abnormal posture: Secondary | ICD-10-CM | POA: Diagnosis present

## 2021-04-05 DIAGNOSIS — M6281 Muscle weakness (generalized): Secondary | ICD-10-CM | POA: Diagnosis present

## 2021-04-05 DIAGNOSIS — D0512 Intraductal carcinoma in situ of left breast: Secondary | ICD-10-CM | POA: Diagnosis present

## 2021-04-05 NOTE — Therapy (Signed)
[facility] @ [place], [place], [address] Phone: [phone]   Fax:  [phone]  Physical Therapy Treatment  Patient Details  Name: Brandi Goodman MRN: [id number] Date of Birth: Oct 08, 1960 Referring Provider (PT): Dr. Marla Roe   Encounter Date: 04/05/2021   PT End of Session - 04/05/21 1605     Visit Number 9    Number of Visits 14    Date for PT Re-Evaluation 04/24/21    PT Start Time 1600    PT Stop Time 1645    PT Time Calculation (min) 45 min    Activity Tolerance Patient tolerated treatment well    Behavior During Therapy [facility] for tasks assessed/performed             Past Medical History:  Diagnosis Date   Anxiety    just lately   Dental crown present    Family history of adverse reaction to anesthesia    pt's father has hx. of post-op N/V   History of breast cancer    Hypothyroidism     Past Surgical History:  Procedure Laterality Date   BREAST EXCISIONAL BIOPSY Left 07/06/2004   BREAST RECONSTRUCTION WITH PLACEMENT OF TISSUE EXPANDER AND FLEX HD (ACELLULAR HYDRATED DERMIS) Left 11/05/2016   Procedure: IMMEDIATE BREAST RECONSTRUCTION WITH PLACEMENT OF TISSUE EXPANDER AND LATISSIMUS FLAP;  Surgeon: [name], DO;  Location: [facility];  Service: Plastics;  Laterality: Left;   CATARACT EXTRACTION W/ INTRAOCULAR LENS IMPLANT Right    HYSTEROSCOPY WITH D & C  02/13/2007; 04/09/2008   LAPAROSCOPIC OOPHERECTOMY     left ovary around 2008   LAPAROSCOPIC SALPINGOOPHERECTOMY Left 05/07/2008   LIPOSUCTION Bilateral 02/21/2017   Procedure: LIPOSUCTION;  Surgeon: [name], DO;  Location: [facility];  Service: Plastics;  Laterality: Bilateral;   MASTECTOMY W/ SENTINEL NODE BIOPSY Left 11/05/2016   Procedure: LEFT MASTECTOMY WITH SENTINEL LYMPH NODE BIOPSY;  Surgeon: [name], MD;  Location: [place] [facility];  Service: General;  Laterality: Left;   MASTECTOMY, PARTIAL Left  05/01/2004   MASTOPEXY Right 02/21/2017   Procedure: MASTOPEXY;  Surgeon: [name], DO;  Location: [facility];  Service: Plastics;  Laterality: Right;   RE-EXCISION OF BREAST LUMPECTOMY Left 05/17/2004   REMOVAL OF BILATERAL TISSUE EXPANDERS WITH PLACEMENT OF BILATERAL BREAST IMPLANTS Left 02/21/2017   Procedure: REMOVAL OF LEFT  TISSUE EXPANDERS WITH PLACEMENT OF BREAST IMPLANT;  Surgeon: [name], DO;  Location: [facility] [place];  Service: Plastics;  Laterality: Left;    There were no vitals filed for this visit.   Subjective Assessment - 04/05/21 1600     Subjective Not as sore as I thought I would be after last visit.  Didn't exercise yesterday because I knew I was coming here. pain is 90% better.    Pertinent History Pt had a left lumpectoy on 05/01/2004 with reexcision on 05/17/2004.  She did not have chemo at the time, but did have radiation. On 11/05/2016 she had a mastectomy with SLNB with immediate reconstruction and lat flap secondary to reoccurence. She did not have PT after that. It hurts more with lifting and reaching.    How long can you sit comfortably? unlimited    How long can you walk comfortably? walking and shopping left upper back fatigues. Can't shop as long    Diagnostic tests [place] chest/ and back    Patient Stated Goals Be able to do normal activities  without increased pain    Currently in Pain? No/denies    Pain Score 0-No pain                               [facility] Adult PT Treatment/Exercise - 04/05/21 0001       Shoulder Exercises: Supine   Diagonals Limitations standing wall walks 3  laps 4 steps on wall.      Shoulder Exercises: Standing   External Rotation Strengthening;Both;15 reps    Theraband Level (Shoulder External Rotation) Level 2 (Red)    Extension Strengthening;Both;15 reps    Theraband Level (Shoulder Extension) Level 2 (Red)    Retraction Strengthening;Left;15 reps    Theraband  Level (Shoulder Retraction) Level 2 (Red)    Shoulder stabs standing yellow ball stabs on wall with PT resistance 2 x 30 sec    Other Standing Exercises elbow flexion 2# x 10 +5    Other Standing Exercises jobes flexion and scaption 2# x 10 +5      Manual Therapy   Manual Therapy Soft tissue mobilization;Passive ROM    Soft tissue mobilization In Supine to pectoralis lateral trunk and lateral border of scapula, then in Rt S/L with cocoa butter along lat flap incision and  also  to Lt upper/levator trap area    Myofascial Release to left pectoralis tendon during PROM    Passive ROM In Supine to Lt shoulder into flexion, abduction and D2 to pts available end ROM                          PT Long Term Goals - 04/05/21 1654       PT LONG TERM GOAL #1   Title Pt will be independent and compliant with HEP for shoulder ROM, strength, stability and posture    Time 4    Period Weeks    Status Achieved      PT LONG TERM GOAL #2   Title Pt will have decreased left thoracic pain by atleast 50% for improved ability to perform home activities    Baseline 90% better    Time 6    Period Weeks    Status Achieved    Target Date 04/05/21      PT LONG TERM GOAL #3   Title Pts left UE will be improved to 4+/5 throughout in available ROM    Baseline improved significantly but not fully achieved    Time 6    Period Weeks    Status On-going      PT LONG TERM GOAL #4   Title Pts quick dash will be improved to no greater than 10%    Baseline 20%    Time 6    Period Weeks    Status Achieved                   Plan - 04/05/21 1606     Clinical Impression Statement Pts pain is 90% better overall.  She has been able to do things like fold clothes without pain and more housework without pain.  She has worked hard on improving her posture and is using good form with exercises.  She notes that she is able to do things and it feels more natural now as her ROM has improved. We  will decrease to 1x for the next week or 2.    Personal Factors and Comorbidities Comorbidity 2  Comorbidities left breast mastectomy with reconstruction and lat flap, increased thoracic kyphosis/scoliosis    Examination-Activity Limitations Reach Overhead;Lift    Examination-Participation Restrictions Cleaning    Stability/Clinical Decision Making Stable/Uncomplicated    Rehab Potential Good    PT Frequency 2x / week    PT Duration 4 weeks    PT Treatment/Interventions ADLs/Self Care Home Management;Therapeutic activities;Therapeutic exercise;Neuromuscular re-education;Manual techniques;Patient/family education;Scar mobilization;Passive range of motion    PT Next Visit Plan cont mSTM prn and PROM, scapular stab exs, shoulder strength, wall walks with band? Decrease to 1x per week.    PT Home Exercise Plan standing TB Scap retraction, bilateral ER, supine horizontal abd, ext all with yellow, single arm chest stretch, jobes flex and scaption    Consulted and Agree with Plan of Care Patient             Patient will benefit from skilled therapeutic intervention in order to improve the following deficits and impairments:  Decreased activity tolerance, Decreased mobility, Decreased strength, Postural dysfunction, Decreased scar mobility, Decreased knowledge of precautions, Pain, Impaired UE functional use, Increased muscle spasms, Decreased range of motion  Visit Diagnosis: Muscle weakness (generalized)  Stiffness of left shoulder, not elsewhere classified  Abnormal posture  Aftercare following surgery for neoplasm  Ductal carcinoma in situ of left breast     Problem List Patient Active Problem List   Diagnosis Date Noted   Mass of skin of back 01/20/2021   Breast cancer ([place]) 11/05/2016   Ductal carcinoma in situ of left breast 10/09/2016   Anemia 05/22/2011    Claris Pong, PT 04/05/2021, 4:56 PM  [facility] @  [place], [place], [address] Phone: [phone]   Fax:  [phone]  Name: Brandi Goodman MRN: [id number] Date of Birth: [birth year]

## 2021-04-12 ENCOUNTER — Ambulatory Visit: Payer: 59

## 2021-04-12 ENCOUNTER — Other Ambulatory Visit: Payer: Self-pay

## 2021-04-12 DIAGNOSIS — D0512 Intraductal carcinoma in situ of left breast: Secondary | ICD-10-CM

## 2021-04-12 DIAGNOSIS — R293 Abnormal posture: Secondary | ICD-10-CM

## 2021-04-12 DIAGNOSIS — Z483 Aftercare following surgery for neoplasm: Secondary | ICD-10-CM

## 2021-04-12 DIAGNOSIS — M6281 Muscle weakness (generalized): Secondary | ICD-10-CM

## 2021-04-12 DIAGNOSIS — M25612 Stiffness of left shoulder, not elsewhere classified: Secondary | ICD-10-CM

## 2021-04-12 NOTE — Therapy (Signed)
[facility] @ [address] [place], [place], [address] Phone: [phone]   Fax:  [phone]  Physical Therapy Treatment  Patient Details  Name: Brandi Goodman MRN: [id number] Date of Birth: [birth year] Referring Provider (PT): Dr. Marla Roe   Encounter Date: 04/12/2021   PT End of Session - 04/12/21 1655     Visit Number 10    Number of Visits 14    Date for PT Re-Evaluation 04/24/21    PT Start Time 1605    PT Stop Time 1655    PT Time Calculation (min) 50 min    Activity Tolerance Patient tolerated treatment well    Behavior During Therapy [facility] for tasks assessed/performed             Past Medical History:  Diagnosis Date   Anxiety    just lately   Dental crown present    Family history of adverse reaction to anesthesia    pt's father has hx. of post-op N/V   History of breast cancer    Hypothyroidism     Past Surgical History:  Procedure Laterality Date   BREAST EXCISIONAL BIOPSY Left 07/06/2004   BREAST RECONSTRUCTION WITH PLACEMENT OF TISSUE EXPANDER AND FLEX HD (ACELLULAR HYDRATED DERMIS) Left 11/05/2016   Procedure: IMMEDIATE BREAST RECONSTRUCTION WITH PLACEMENT OF TISSUE EXPANDER AND LATISSIMUS FLAP;  Surgeon: [name], DO;  Location: [place];  Service: Plastics;  Laterality: Left;   CATARACT EXTRACTION W/ INTRAOCULAR LENS IMPLANT Right    HYSTEROSCOPY WITH D & C  02/13/2007; 04/09/2008   LAPAROSCOPIC OOPHERECTOMY     left ovary around 2008   LAPAROSCOPIC SALPINGOOPHERECTOMY Left 05/07/2008   LIPOSUCTION Bilateral 02/21/2017   Procedure: LIPOSUCTION;  Surgeon: [name], DO;  Location: [place];  Service: Plastics;  Laterality: Bilateral;   MASTECTOMY W/ SENTINEL NODE BIOPSY Left 11/05/2016   Procedure: LEFT MASTECTOMY WITH SENTINEL LYMPH NODE BIOPSY;  Surgeon: [name], MD;  Location: [place];  Service: General;  Laterality: Left;   MASTECTOMY, PARTIAL Left  05/01/2004   MASTOPEXY Right 02/21/2017   Procedure: MASTOPEXY;  Surgeon: [name], DO;  Location: [place];  Service: Plastics;  Laterality: Right;   RE-EXCISION OF BREAST LUMPECTOMY Left 05/17/2004   REMOVAL OF BILATERAL TISSUE EXPANDERS WITH PLACEMENT OF BILATERAL BREAST IMPLANTS Left 02/21/2017   Procedure: REMOVAL OF LEFT  TISSUE EXPANDERS WITH PLACEMENT OF BREAST IMPLANT;  Surgeon: [name], DO;  Location: [place];  Service: Plastics;  Laterality: Left;    There were no vitals filed for this visit.   Subjective Assessment - 04/12/21 1604     Subjective I have been doing well.  Saturday we decided to clean the garage and moved everything, cleaned shelves etc.  I got tired but I didn't have pain.  I really haven't had pain in a few weeks    Pertinent History Pt had a left lumpectoy on 05/01/2004 with reexcision on 05/17/2004.  She did not have chemo at the time, but did have radiation. On 11/05/2016 she had a mastectomy with SLNB with immediate reconstruction and lat flap secondary to reoccurence. She did not have PT after that. It hurts more with lifting and reaching.    Diagnostic tests [place] chest/ and back    Patient Stated Goals Be able to do normal activities without increased pain    Currently in Pain? No/denies    Pain Score 0-No pain  [facility] PT Assessment - 04/12/21 0001       Assessment   Medical Diagnosis Left breast CA/mid back pain    Referring Provider (PT) Dr. Marla Roe    Onset Date/Surgical Date 11/05/16    Hand Dominance Left      Precautions   Precaution Comments lymphedema risk      Prior Function   Level of Independence Independent      Strength   Right Shoulder Flexion 5/5    Right Shoulder ABduction 5/5    Right Shoulder Internal Rotation 5/5    Right Shoulder External Rotation 5/5    Left Shoulder Flexion 5/5    Left Shoulder ABduction 5/5    Left Shoulder Internal Rotation  5/5    Left Shoulder External Rotation 5/5                           [facility] Adult PT Treatment/Exercise - 04/12/21 0001       Exercises   Other Exercises  ABC strength handout 1-16, 5-6 reps of strength exs, 1 # for chest press      Manual Therapy   Soft tissue mobilization In Supine to pectoralis lateral trunk and lateral border of scapula   Myofascial Release to left pectoralis tendon during PROM    Passive ROM In Supine to Lt shoulder into flexion, abduction and D2 to pts available end ROM                          PT Long Term Goals - 04/12/21 1659       PT LONG TERM GOAL #1   Title Pt will be independent and compliant with HEP for shoulder ROM, strength, stability and posture    Period Weeks    Status Achieved      PT LONG TERM GOAL #2   Title Pt will have decreased left thoracic pain by atleast 50% for improved ability to perform home activities    Time 6    Period Weeks    Status Achieved      PT LONG TERM GOAL #3   Title Pts left UE will be improved to 4+/5 throughout in available ROM    Baseline 5/5    Period Weeks    Status Achieved      PT LONG TERM GOAL #4   Title Pts quick dash will be improved to no greater than 10%    Baseline 4.55    Time 6    Status Achieved      PT LONG TERM GOAL #5   Title Pt will be educated in [facility] strength handout and will be independent in performing    Time 1    Period Weeks    Status New    Target Date 04/19/21                   Plan - 04/12/21 1656     Clinical Impression Statement Pt is making excellent progress.  She was able to clean the garage without increased pain. Strength testing today was 5/5 of bilateral shoulders without pain.  We inititiated ABC strength handout and will complete in her last visit next week. Excellent progress    Personal Factors and Comorbidities Comorbidity 2    Comorbidities left breast mastectomy with reconstruction and lat flap, increased thoracic  kyphosis/scoliosis    Examination-Activity Limitations Reach Overhead;Lift    Examination-Participation Restrictions Cleaning  Stability/Clinical Decision Making Stable/Uncomplicated    Rehab Potential Good    PT Frequency 2x / week    PT Duration 4 weeks    PT Treatment/Interventions ADLs/Self Care Home Management;Therapeutic activities;Therapeutic exercise;Neuromuscular re-education;Manual techniques;Patient/family education;Scar mobilization;Passive range of motion    PT Next Visit Plan ABC strength handout 16 to the end and review all with pt/DC    PT Home Exercise Plan standing TB Scap retraction, bilateral ER, supine horizontal abd, ext all with yellow, single arm chest stretch, jobes flex and scaption, ABC strength 1-16    Consulted and Agree with Plan of Care Patient             Patient will benefit from skilled therapeutic intervention in order to improve the following deficits and impairments:  Decreased activity tolerance, Decreased mobility, Decreased strength, Postural dysfunction, Decreased scar mobility, Decreased knowledge of precautions, Pain, Impaired UE functional use, Increased muscle spasms, Decreased range of motion  Visit Diagnosis: Muscle weakness (generalized)  Stiffness of left shoulder, not elsewhere classified  Abnormal posture  Aftercare following surgery for neoplasm  Ductal carcinoma in situ of left breast     Problem List Patient Active Problem List   Diagnosis Date Noted   Mass of skin of back 01/20/2021   Breast cancer ([place]) 11/05/2016   Ductal carcinoma in situ of left breast 10/09/2016   Anemia 05/22/2011    Claris Pong, PT 04/12/2021, 5:01 PM  [facility] @ [place], [place], [address] Phone: [phone]   Fax:  [phone]  Name: Brandi Goodman MRN: [id number] Date of Birth: [birth year]

## 2021-04-17 ENCOUNTER — Encounter: Payer: 59 | Admitting: Physical Therapy

## 2021-04-19 ENCOUNTER — Ambulatory Visit: Payer: 59

## 2021-04-19 ENCOUNTER — Other Ambulatory Visit: Payer: Self-pay

## 2021-04-19 DIAGNOSIS — M6281 Muscle weakness (generalized): Secondary | ICD-10-CM

## 2021-04-19 DIAGNOSIS — Z483 Aftercare following surgery for neoplasm: Secondary | ICD-10-CM

## 2021-04-19 DIAGNOSIS — D0512 Intraductal carcinoma in situ of left breast: Secondary | ICD-10-CM

## 2021-04-19 DIAGNOSIS — R293 Abnormal posture: Secondary | ICD-10-CM

## 2021-04-19 DIAGNOSIS — M25612 Stiffness of left shoulder, not elsewhere classified: Secondary | ICD-10-CM

## 2021-04-19 NOTE — Therapy (Signed)
[place] @ [address] [place], [place], [address] Phone: [phone]   Fax:  [phone]  Physical Therapy Treatment  Patient Details  Name: Brandi Goodman MRN: [id number] Date of Birth: February 27, 1961 Referring Provider (PT): Dr. Marla Roe   Encounter Date: 04/19/2021   PT End of Session - 04/19/21 1614     Visit Number 11    Number of Visits 14    Date for PT Re-Evaluation 04/24/21    PT Start Time 1602    PT Stop Time 1656    PT Time Calculation (min) 54 min    Activity Tolerance Patient tolerated treatment well    Behavior During Therapy [facility] for tasks assessed/performed             Past Medical History:  Diagnosis Date   Anxiety    just lately   Dental crown present    Family history of adverse reaction to anesthesia    pt's father has hx. of post-op N/V   History of breast cancer    Hypothyroidism     Past Surgical History:  Procedure Laterality Date   BREAST EXCISIONAL BIOPSY Left 07/06/2004   BREAST RECONSTRUCTION WITH PLACEMENT OF TISSUE EXPANDER AND FLEX HD (ACELLULAR HYDRATED DERMIS) Left 11/05/2016   Procedure: IMMEDIATE BREAST RECONSTRUCTION WITH PLACEMENT OF TISSUE EXPANDER AND LATISSIMUS FLAP;  Surgeon: [name], DO;  Location: [place];  Service: Plastics;  Laterality: Left;   CATARACT EXTRACTION W/ INTRAOCULAR LENS IMPLANT Right    HYSTEROSCOPY WITH D & C  02/13/2007; 04/09/2008   LAPAROSCOPIC OOPHERECTOMY     left ovary around 2008   LAPAROSCOPIC SALPINGOOPHERECTOMY Left 05/07/2008   LIPOSUCTION Bilateral 02/21/2017   Procedure: LIPOSUCTION;  Surgeon: [name], DO;  Location: [place];  Service: Plastics;  Laterality: Bilateral;   MASTECTOMY W/ SENTINEL NODE BIOPSY Left 11/05/2016   Procedure: LEFT MASTECTOMY WITH SENTINEL LYMPH NODE BIOPSY;  Surgeon: [name], MD;  Location: [place];  Service: General;  Laterality: Left;   MASTECTOMY, PARTIAL Left  05/01/2004   MASTOPEXY Right 02/21/2017   Procedure: MASTOPEXY;  Surgeon: [name], DO;  Location: [place];  Service: Plastics;  Laterality: Right;   RE-EXCISION OF BREAST LUMPECTOMY Left 05/17/2004   REMOVAL OF BILATERAL TISSUE EXPANDERS WITH PLACEMENT OF BILATERAL BREAST IMPLANTS Left 02/21/2017   Procedure: REMOVAL OF LEFT  TISSUE EXPANDERS WITH PLACEMENT OF BREAST IMPLANT;  Surgeon: [name], DO;  Location: [place];  Service: Plastics;  Laterality: Left;    There were no vitals filed for this visit.   Subjective Assessment - 04/19/21 1602     Subjective I have done very well.  I vacuumed and mopped my kitchen and dining room without needing any help, I vacuumed the rest of the house and scrubbed the shower all on Friday..  I shopped for 3-4 hours on Saturday and had no pain.    Pertinent History Pt had a left lumpectoy on 05/01/2004 with reexcision on 05/17/2004.  She did not have chemo at the time, but did have radiation. On 11/05/2016 she had a mastectomy with SLNB with immediate reconstruction and lat flap secondary to reoccurence. She did not have PT after that. It hurts more with lifting and reaching.    How long can you walk comfortably? walking and shopping left upper back fatigues. Can't shop as long    Diagnostic tests [place] chest/ and back    Patient  Stated Goals Be able to do normal activities without increased pain    Currently in Pain? No/denies    Pain Score 0-No pain                               [facility] Adult PT Treatment/Exercise - 04/19/21 0001       Exercises   Other Exercises  ABC strength #16 to 26. 1 #wts.  Did not do dead lifts, and practiced mini squats to table but held secondary to LBP. Discussed proper way to progress reps/resistance and to discontinue any exs that causes pain. Discussed and answered questions about theraband exercises and continuing to perform stretches so her shoulder  does not tighten up.                          PT Long Term Goals - 04/19/21 1641       PT LONG TERM GOAL #1   Title Pt will be independent and compliant with HEP for shoulder ROM, strength, stability and posture    Time 4    Period Weeks    Status Achieved      PT LONG TERM GOAL #2   Title Pt will have decreased left thoracic pain by atleast 50% for improved ability to perform home activities    Baseline 90% better    Period Weeks    Status Achieved      PT LONG TERM GOAL #3   Title Pts left UE will be improved to 4+/5 throughout in available ROM    Baseline 5/5    Time 6    Status Achieved      PT LONG TERM GOAL #4   Title Pts quick dash will be improved to no greater than 10%    Baseline 4.55    Time 6    Period Weeks    Status Achieved      PT LONG TERM GOAL #5   Title Pt will be educated in [facility] strength handout and will be independent in performing    Time 1    Period Weeks    Status Achieved    Target Date 04/19/21                   Plan - 04/19/21 1701     Clinical Impression Statement Pt has made excellent progress. She has had good improvement in ROM and reduction of pain. She has achieved all goals established and is very compliant with her HEP. We discussed a prophylactic compression sleeve, but she will hold on that for now.  We discussed and practiced all exercises on ABC strength handout except dead lifts and mini squats and modified sitting cross legged stretch to do i leg at a time in sitting. We discussed proper way to progress repetitions and resistance and she verbalized understanding.  She has the ABC strength handout with thorough instructions.  She is now able to shop and do multiple household chores without upper back/shoulder pain. She was advised to call with any questions or concerns.  She is discharged to HEP    Personal Factors and Comorbidities Comorbidity 2    Comorbidities left breast mastectomy with reconstruction  and lat flap, increased thoracic kyphosis/scoliosis    Examination-Activity Limitations Reach Overhead;Lift    Examination-Participation Restrictions Cleaning    Stability/Clinical Decision Making Stable/Uncomplicated    Rehab Potential Good    PT  Frequency 2x / week    PT Treatment/Interventions ADLs/Self Care Home Management;Therapeutic activities;Therapeutic exercise;Neuromuscular re-education;Manual techniques;Patient/family education;Scar mobilization;Passive range of motion    PT Next Visit Plan DC to HEP    PT Home Exercise Plan standing TB Scap retraction, bilateral ER, supine horizontal abd, ext all with yellow, single arm chest stretch, jobes flex and scaption, ABC strength handout except dead lifts/mini squats    Consulted and Agree with Plan of Care Patient             Patient will benefit from skilled therapeutic intervention in order to improve the following deficits and impairments:  Decreased activity tolerance, Decreased mobility, Decreased strength, Postural dysfunction, Decreased scar mobility, Decreased knowledge of precautions, Pain, Impaired UE functional use, Increased muscle spasms, Decreased range of motion  Visit Diagnosis: Muscle weakness (generalized)  Stiffness of left shoulder, not elsewhere classified  Abnormal posture  Aftercare following surgery for neoplasm  Ductal carcinoma in situ of left breast     Problem List Patient Active Problem List   Diagnosis Date Noted   Mass of skin of back 01/20/2021   Breast cancer ([place]) 11/05/2016   Ductal carcinoma in situ of left breast 10/09/2016   Anemia 05/22/2011    Claris Pong, PT 04/19/2021, 5:08 PM  [facility] @ [place], [place], [address] Phone: [phone]   Fax:  [phone]  Name: Brandi Goodman MRN: [id number] Date of Birth: [birth year]  PHYSICAL THERAPY DISCHARGE SUMMARY  Visits from Start of Care:  11  Current functional level related to goals / functional outcomes: Achieved all goals   Remaining deficits: NA   Education / Equipment: theraband   Patient agrees to discharge. Patient goals were met. Patient is being discharged due to meeting the stated rehab goals.

## 2021-10-27 ENCOUNTER — Other Ambulatory Visit: Payer: Self-pay | Admitting: Internal Medicine

## 2021-10-27 DIAGNOSIS — Z1231 Encounter for screening mammogram for malignant neoplasm of breast: Secondary | ICD-10-CM

## 2021-11-07 ENCOUNTER — Ambulatory Visit: Payer: 59

## 2021-11-08 ENCOUNTER — Ambulatory Visit
Admission: RE | Admit: 2021-11-08 | Discharge: 2021-11-08 | Disposition: A | Discharge status: Home / Self Care | Payer: 59 | Source: Ambulatory Visit | Attending: Internal Medicine | Admitting: Internal Medicine

## 2021-11-08 DIAGNOSIS — Z1231 Encounter for screening mammogram for malignant neoplasm of breast: Secondary | ICD-10-CM

## 2022-01-23 ENCOUNTER — Encounter: Payer: Self-pay | Admitting: Plastic Surgery

## 2022-01-23 ENCOUNTER — Ambulatory Visit (INDEPENDENT_AMBULATORY_CARE_PROVIDER_SITE_OTHER): Payer: 59 | Admitting: Plastic Surgery

## 2022-01-23 DIAGNOSIS — D0512 Intraductal carcinoma in situ of left breast: Secondary | ICD-10-CM | POA: Diagnosis not present

## 2022-01-23 DIAGNOSIS — C50912 Malignant neoplasm of unspecified site of left female breast: Secondary | ICD-10-CM

## 2022-01-23 NOTE — Progress Notes (Signed)
Patient ID: Brandi Goodman, female    DOB: [birth year], 61 y.o.   MRN: [id number]   Chief Complaint  Patient presents with   Follow-up   Breast Problem    The patient is a 61 year old female here for 1 year follow-up on her breast reconstruction.  She had left breast mastectomy with reconstruction in 2018.  She had a mastopexy on the right side.  Overall she is doing really well.  She is remarkably soft on the left side considering she had radiation.  The capsule feels soft.  No irregularities or areas of concern noted.    Review of Systems  Constitutional: Negative.   HENT: Negative.    Eyes: Negative.   Respiratory: Negative.  Negative for chest tightness and shortness of breath.   Cardiovascular: Negative.  Negative for leg swelling.  Gastrointestinal: Negative.   Endocrine: Negative.   Genitourinary: Negative.   Allergic/Immunologic: Negative.     Past Medical History:  Diagnosis Date   Anxiety    just lately   Dental crown present    Family history of adverse reaction to anesthesia    pt's father has hx. of post-op N/V   History of breast cancer    Hypothyroidism     Past Surgical History:  Procedure Laterality Date   BREAST EXCISIONAL BIOPSY Left 07/06/2004   BREAST RECONSTRUCTION WITH PLACEMENT OF TISSUE EXPANDER AND FLEX HD (ACELLULAR HYDRATED DERMIS) Left 11/05/2016   Procedure: IMMEDIATE BREAST RECONSTRUCTION WITH PLACEMENT OF TISSUE EXPANDER AND LATISSIMUS FLAP;  Surgeon: [name], DO;  Location: [place];  Service: Plastics;  Laterality: Left;   CATARACT EXTRACTION W/ INTRAOCULAR LENS IMPLANT Right    HYSTEROSCOPY WITH D & C  02/13/2007; 04/09/2008   LAPAROSCOPIC OOPHERECTOMY     left ovary around 2008   LAPAROSCOPIC SALPINGOOPHERECTOMY Left 05/07/2008   LIPOSUCTION Bilateral 02/21/2017   Procedure: LIPOSUCTION;  Surgeon: [name], DO;  Location: [place];  Service: Plastics;  Laterality: Bilateral;   MASTECTOMY W/  SENTINEL NODE BIOPSY Left 11/05/2016   Procedure: LEFT MASTECTOMY WITH SENTINEL LYMPH NODE BIOPSY;  Surgeon: [name], MD;  Location: [place];  Service: General;  Laterality: Left;   MASTECTOMY, PARTIAL Left 05/01/2004   MASTOPEXY Right 02/21/2017   Procedure: MASTOPEXY;  Surgeon: [name], DO;  Location: [place];  Service: Plastics;  Laterality: Right;   RE-EXCISION OF BREAST LUMPECTOMY Left 05/17/2004   REMOVAL OF BILATERAL TISSUE EXPANDERS WITH PLACEMENT OF BILATERAL BREAST IMPLANTS Left 02/21/2017   Procedure: REMOVAL OF LEFT  TISSUE EXPANDERS WITH PLACEMENT OF BREAST IMPLANT;  Surgeon: [name], DO;  Location: [place];  Service: Plastics;  Laterality: Left;      Current Outpatient Medications:    DOCOSAHEXAENOIC ACID PO, Take 1 g by mouth., Disp: , Rfl:    levothyroxine (SYNTHROID, LEVOTHROID) 125 MCG tablet, Take 125 mcg by mouth daily before breakfast., Disp: , Rfl:    Multiple Vitamin (MULTIVITAMIN) tablet, Take 1 tablet by mouth daily., Disp: , Rfl:    Objective:   There were no vitals filed for this visit.  Physical Exam Constitutional:      Appearance: Normal appearance.  HENT:     Head: Normocephalic and atraumatic.  Cardiovascular:     Rate and Rhythm: Normal rate.     Pulses: Normal pulses.  Pulmonary:     Effort: Pulmonary effort is normal.  Abdominal:     General: There is no  distension.     Palpations: Abdomen is soft.     Tenderness: There is no abdominal tenderness.  Musculoskeletal:        General: No swelling.  Skin:    General: Skin is warm.     Capillary Refill: Capillary refill takes less than 2 seconds.     Coloration: Skin is not jaundiced.  Neurological:     Mental Status: She is alert and oriented to person, place, and time.  Psychiatric:        Mood and Affect: Mood normal.        Behavior: Behavior normal.        Thought Content: Thought content normal.        Judgment: Judgment  normal.     Assessment & Plan:  Ductal carcinoma in situ of left breast  Malignant neoplasm of left female breast, unspecified estrogen receptor status, unspecified site of breast ([place])  We talked about the possibility of nipple tattooing.  She does not want to do that right now but she is going to think about it.  We do not need to do any radiologic exams.  If she has any concerns we certainly can.  Overall I am very happy for her and she is doing really well.  I would like to see her back in 1 year.  No pictures taken as epic not connecting at this [place], DO

## 2022-11-16 ENCOUNTER — Other Ambulatory Visit: Payer: Self-pay | Admitting: Internal Medicine

## 2022-11-16 DIAGNOSIS — Z1231 Encounter for screening mammogram for malignant neoplasm of breast: Secondary | ICD-10-CM

## 2022-11-23 ENCOUNTER — Other Ambulatory Visit: Payer: Self-pay | Admitting: Internal Medicine

## 2022-11-23 DIAGNOSIS — M810 Age-related osteoporosis without current pathological fracture: Secondary | ICD-10-CM

## 2022-12-10 ENCOUNTER — Ambulatory Visit: Admission: RE | Admit: 2022-12-10 | Payer: 59 | Source: Ambulatory Visit

## 2022-12-10 DIAGNOSIS — Z1231 Encounter for screening mammogram for malignant neoplasm of breast: Secondary | ICD-10-CM

## 2023-01-29 ENCOUNTER — Ambulatory Visit: Payer: 59 | Admitting: Plastic Surgery

## 2023-04-09 ENCOUNTER — Ambulatory Visit (INDEPENDENT_AMBULATORY_CARE_PROVIDER_SITE_OTHER): Payer: 59 | Admitting: Plastic Surgery

## 2023-04-09 ENCOUNTER — Encounter: Payer: Self-pay | Admitting: Plastic Surgery

## 2023-04-09 VITALS — BP 129/77 | HR 79

## 2023-04-09 DIAGNOSIS — C50912 Malignant neoplasm of unspecified site of left female breast: Secondary | ICD-10-CM

## 2023-04-09 DIAGNOSIS — R222 Localized swelling, mass and lump, trunk: Secondary | ICD-10-CM | POA: Diagnosis not present

## 2023-04-09 NOTE — Progress Notes (Signed)
Patient ID: Brandi Goodman, female    DOB: [birth year], 62 y.o.   MRN: [id number]   Chief Complaint  Patient presents with   Follow-up    The patient is a 62 year old female here for follow-up for her 1 year after breast reconstruction.  She was originally seen in 2018.  She had undergone lumpectomies with radiation of the left breast.  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  She did have a family history of breast cancer.  She is 5 feet 7 inches tall.  She wanted to move ahead with a left mastectomy and reconstruction.  In June 2018, she underwent a latissimus muscle flap with expander placement to the left breast.  She then had exchange in September 2018 and has a Dance movement psychotherapist smooth round ultra high-profile 455 cc implant in place.  He also had a right mastopexy for symmetry.  She is pleased with the way she looks and does not desire anything additional.  Due to the timeframe of 6 years we talked about ultrasound evaluation of the left breast.  She still has a mass in the left thorax posteriorly.  This does not seem to have changed at all and it is not bothering the patient so she is okay leaving it alone for right now.  She knows to let me know if it gets any larger.    Review of Systems  Constitutional: Negative.   HENT: Negative.    Eyes: Negative.   Respiratory: Negative.  Negative for chest tightness and shortness of breath.   Cardiovascular: Negative.   Gastrointestinal: Negative.   Endocrine: Negative.   Genitourinary: Negative.   Musculoskeletal: Negative.     Past Medical History:  Diagnosis Date   Anxiety    just lately   Dental crown present    Family history of adverse reaction to anesthesia    pt's father has hx. of post-op N/V   History of breast cancer    Hypothyroidism     Past Surgical History:  Procedure Laterality Date   BREAST EXCISIONAL BIOPSY Left 07/06/2004    BREAST RECONSTRUCTION WITH PLACEMENT OF TISSUE EXPANDER AND FLEX HD (ACELLULAR HYDRATED DERMIS) Left 11/05/2016   Procedure: IMMEDIATE BREAST RECONSTRUCTION WITH PLACEMENT OF TISSUE EXPANDER AND LATISSIMUS FLAP;  Surgeon: Peggye Form, DO;  Location: [facility] OR;  Service: Plastics;  Laterality: Left;   CATARACT EXTRACTION W/ INTRAOCULAR LENS IMPLANT Right    HYSTEROSCOPY WITH D & C  02/13/2007; 04/09/2008   LAPAROSCOPIC OOPHERECTOMY     left ovary around 2008   LAPAROSCOPIC SALPINGOOPHERECTOMY Left 05/07/2008   LIPOSUCTION Bilateral 02/21/2017   Procedure: LIPOSUCTION;  Surgeon: Peggye Form, DO;  Location: [facility];  Service: Plastics;  Laterality: Bilateral;   MASTECTOMY W/ SENTINEL NODE BIOPSY Left 11/05/2016   Procedure: LEFT MASTECTOMY WITH SENTINEL LYMPH NODE BIOPSY;  Surgeon: Griselda Miner, MD;  Location: [facility] OR;  Service: General;  Laterality: Left;   MASTECTOMY, PARTIAL Left 05/01/2004   MASTOPEXY Right 02/21/2017   Procedure: MASTOPEXY;  Surgeon: Peggye Form, DO;  Location: [facility];  Service: Plastics;  Laterality: Right;   RE-EXCISION OF BREAST LUMPECTOMY Left 05/17/2004   REMOVAL OF BILATERAL TISSUE EXPANDERS WITH PLACEMENT OF BILATERAL BREAST IMPLANTS Left 02/21/2017   Procedure: REMOVAL OF LEFT  TISSUE EXPANDERS WITH PLACEMENT OF BREAST IMPLANT;  Surgeon: Peggye Form, DO;  Location: [facility];  Service: Plastics;  Laterality: Left;      Current Outpatient Medications:    alendronate (FOSAMAX) 70 MG tablet, PLEASE SEE ATTACHED FOR DETAILED DIRECTIONS, Disp: , Rfl:    DOCOSAHEXAENOIC ACID PO, Take 1 g by mouth., Disp: , Rfl:    levothyroxine (SYNTHROID, LEVOTHROID) 125 MCG tablet, Take 125 mcg by mouth daily before breakfast., Disp: , Rfl:    Objective:   Vitals:   04/09/23 1407  BP: 129/77  Pulse: 79  SpO2: 98%    Physical Exam Vitals reviewed.  Constitutional:      Appearance: Normal  appearance.  HENT:     Head: Normocephalic and atraumatic.  Cardiovascular:     Rate and Rhythm: Normal rate.     Pulses: Normal pulses.  Pulmonary:     Effort: Pulmonary effort is normal.  Abdominal:     General: There is no distension.     Palpations: Abdomen is soft.     Tenderness: There is no abdominal tenderness.  Musculoskeletal:        General: No swelling or deformity.  Skin:    General: Skin is warm.  Neurological:     General: No focal deficit present.     Mental Status: She is alert and oriented to person, place, and time.  Psychiatric:        Mood and Affect: Mood normal.        Behavior: Behavior normal.        Thought Content: Thought content normal.        Judgment: Judgment normal.     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  We will plan to see her back in 1 year for follow-up.  Pictures were obtained of the patient and placed in the chart with the patient's or guardian's permission.   Alena Bills Tyishia Aune, DO

## 2023-04-10 ENCOUNTER — Encounter: Payer: Self-pay | Admitting: Plastic Surgery

## 2023-08-23 ENCOUNTER — Ambulatory Visit
Admission: RE | Admit: 2023-08-23 | Discharge: 2023-08-23 | Disposition: A | Discharge status: Home / Self Care | Payer: 59 | Source: Ambulatory Visit | Attending: Internal Medicine | Admitting: Internal Medicine

## 2023-08-23 DIAGNOSIS — M810 Age-related osteoporosis without current pathological fracture: Secondary | ICD-10-CM

## 2024-01-22 ENCOUNTER — Other Ambulatory Visit: Payer: Self-pay | Admitting: Internal Medicine

## 2024-01-22 DIAGNOSIS — Z1231 Encounter for screening mammogram for malignant neoplasm of breast: Secondary | ICD-10-CM

## 2024-02-07 ENCOUNTER — Ambulatory Visit
Admission: RE | Admit: 2024-02-07 | Discharge: 2024-02-07 | Disposition: A | Discharge status: Home / Self Care | Source: Ambulatory Visit | Attending: Internal Medicine | Admitting: Internal Medicine

## 2024-02-07 DIAGNOSIS — Z1231 Encounter for screening mammogram for malignant neoplasm of breast: Secondary | ICD-10-CM
